# Patient Record
Sex: Male | Born: 1965 | Race: White | Hispanic: No | Marital: Single
Health system: Midwestern US, Academic
[De-identification: ages and names within clinical notes are randomized; demographics above are authoritative.]

---

## 2021-06-05 IMAGING — CR [ID]
3 series · 3 of 3 positions shown · non-contrast
Comparison: none

[chest pa (1 of 2)]
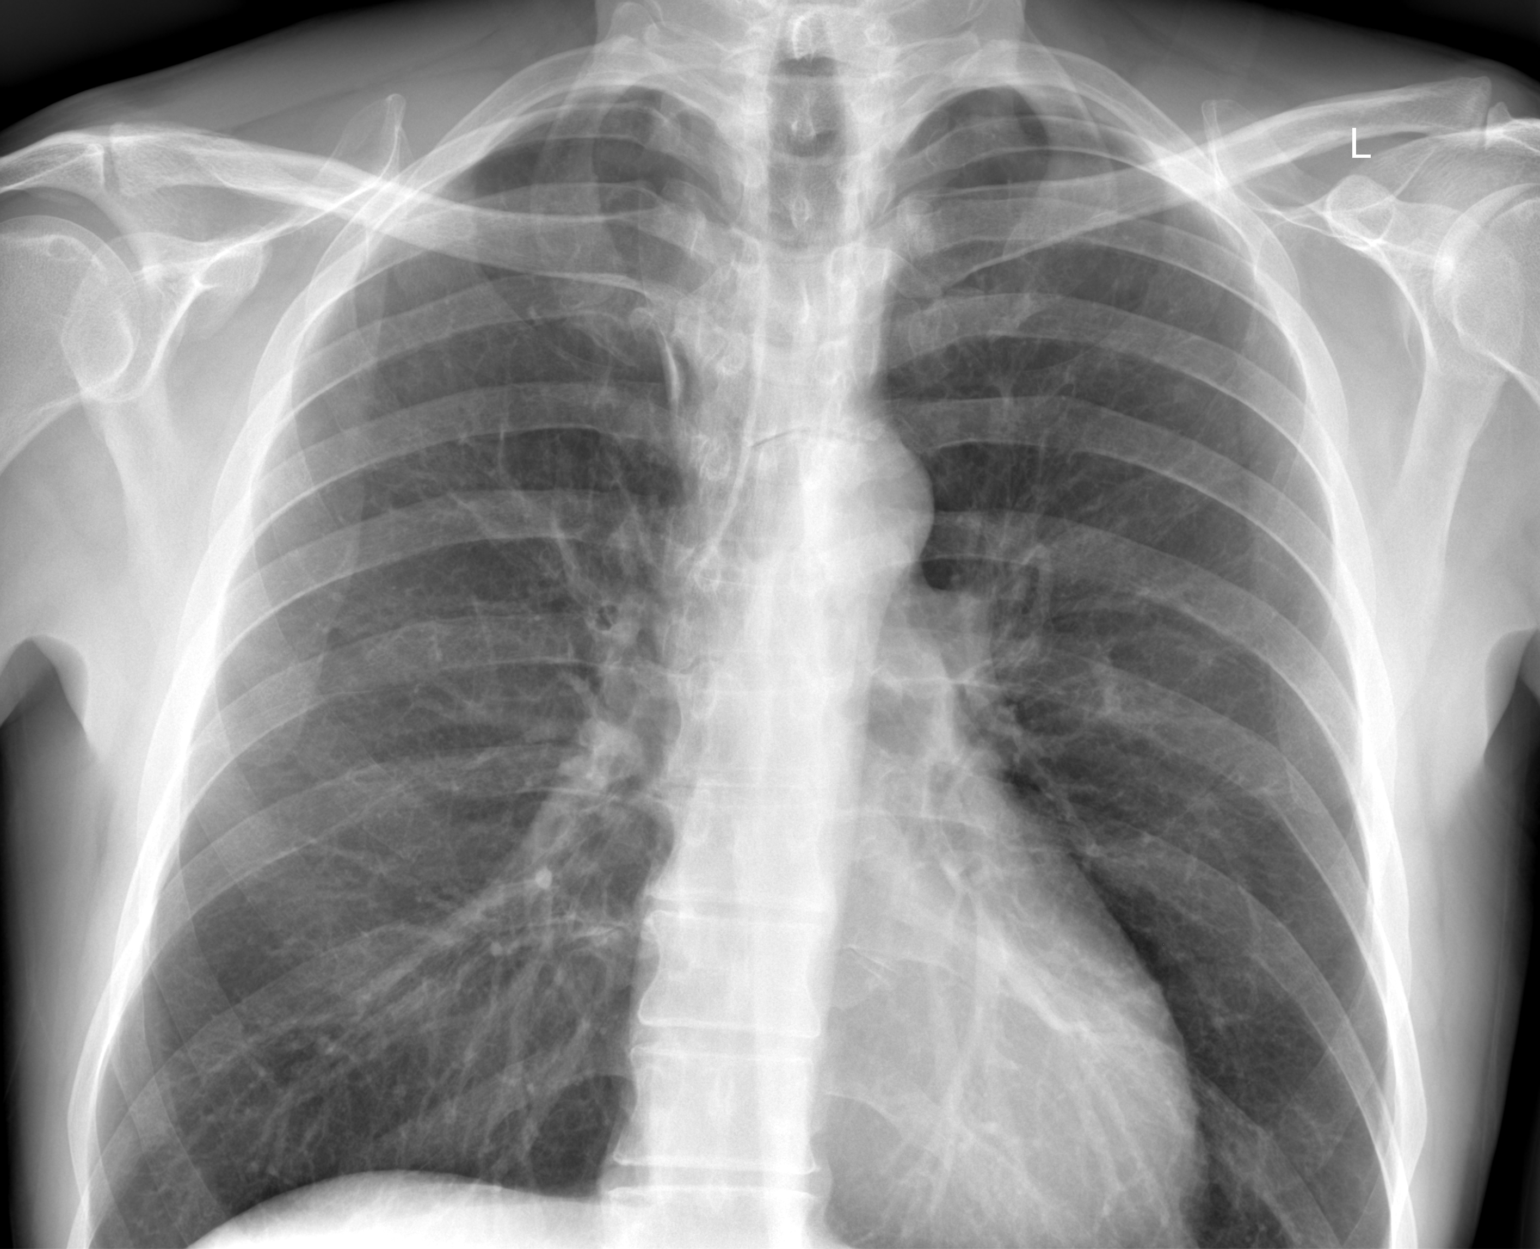

[chest pa (2 of 2)]
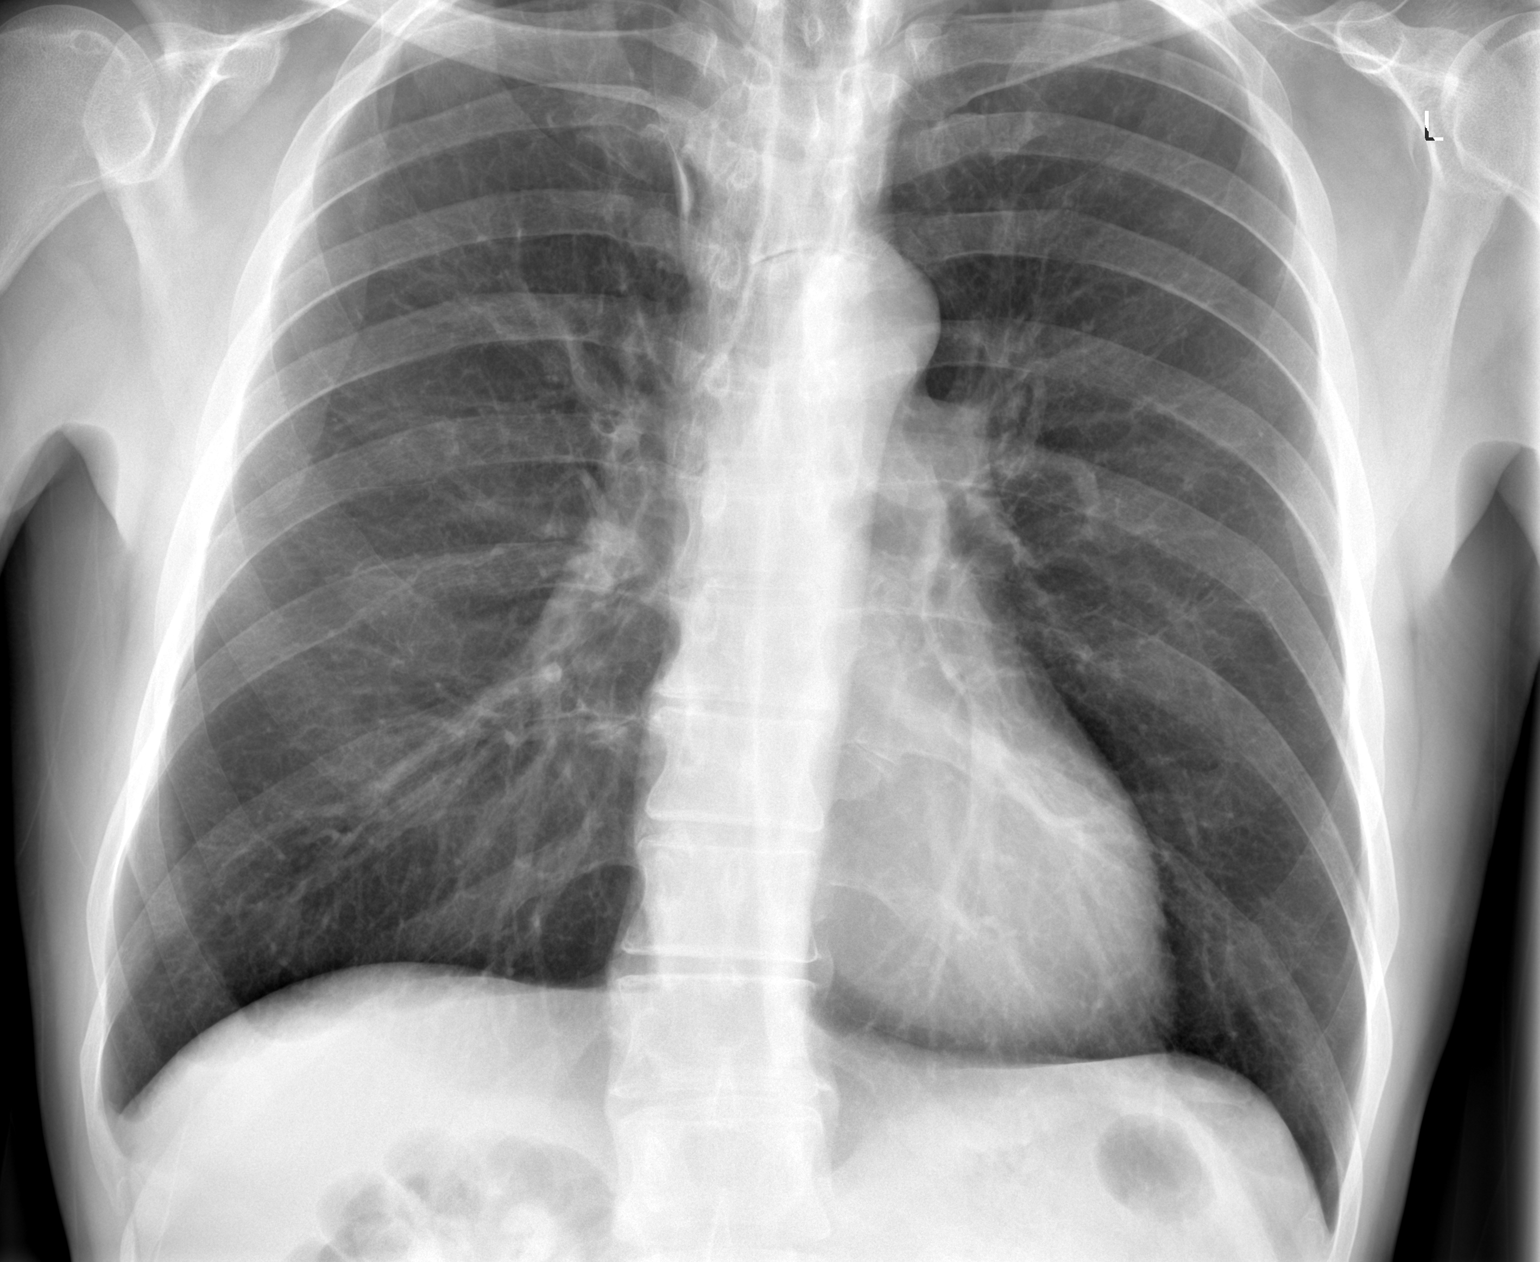

[chest lat]
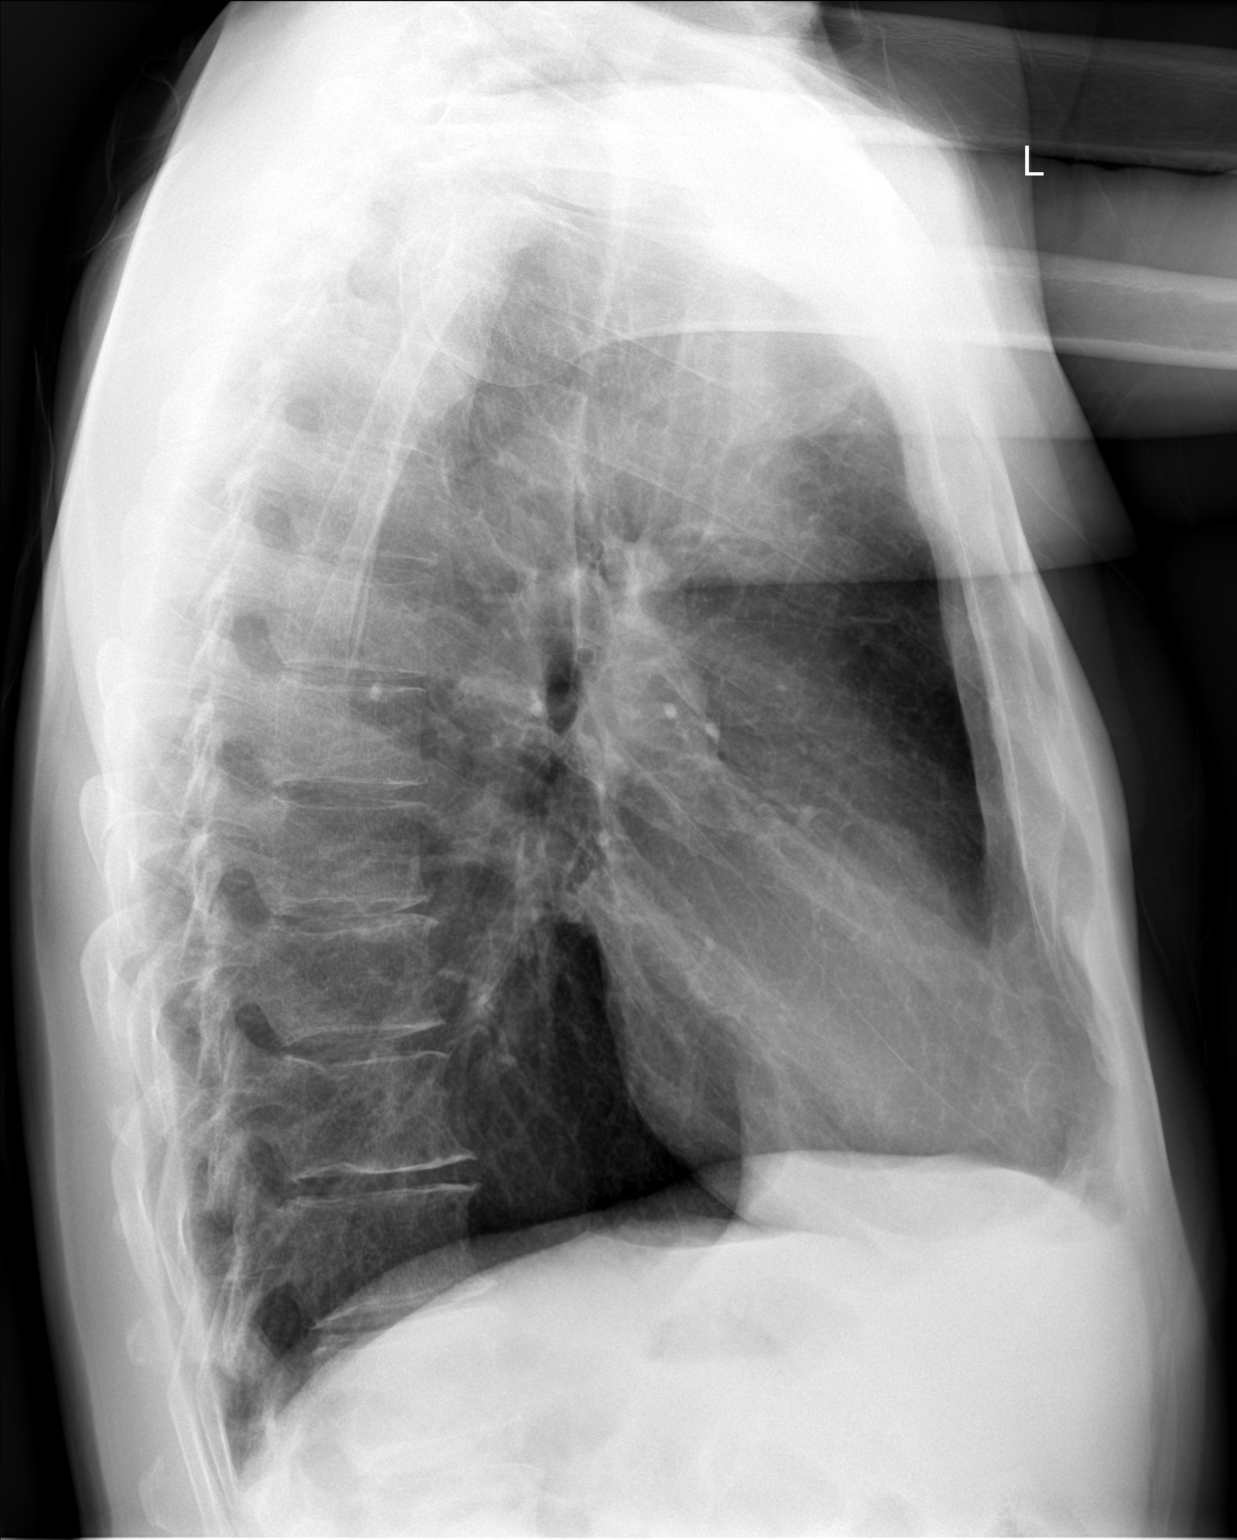

[3 of 3 positions shown; findings below may reference images not displayed]

EXAM

XR chest 2V

INDICATION

EKG findings
Abnormal EKG findings. Pt denies sx hx. CF

TECHNIQUE

PA and Lateral views of the chest

COMPARISONS

None available at the time of dictation.

FINDINGS

No radiographically apparent pleural effusion, consolidation, or pneumothorax.

The cardiomediastinal silhouette is normal in size.

The osseous structures are without an acute osseous abnormality.

IMPRESSION
1. No radiographic evidence of an acute cardiopulmonary process.

Tech Notes:

Abnormal EKG findings. Pt denies sx hx. CF

## 2021-06-24 IMAGING — CR [ID]
2 series · 2 of 2 positions shown · non-contrast
Comparison: none

[x chest ap (1 of 2)]
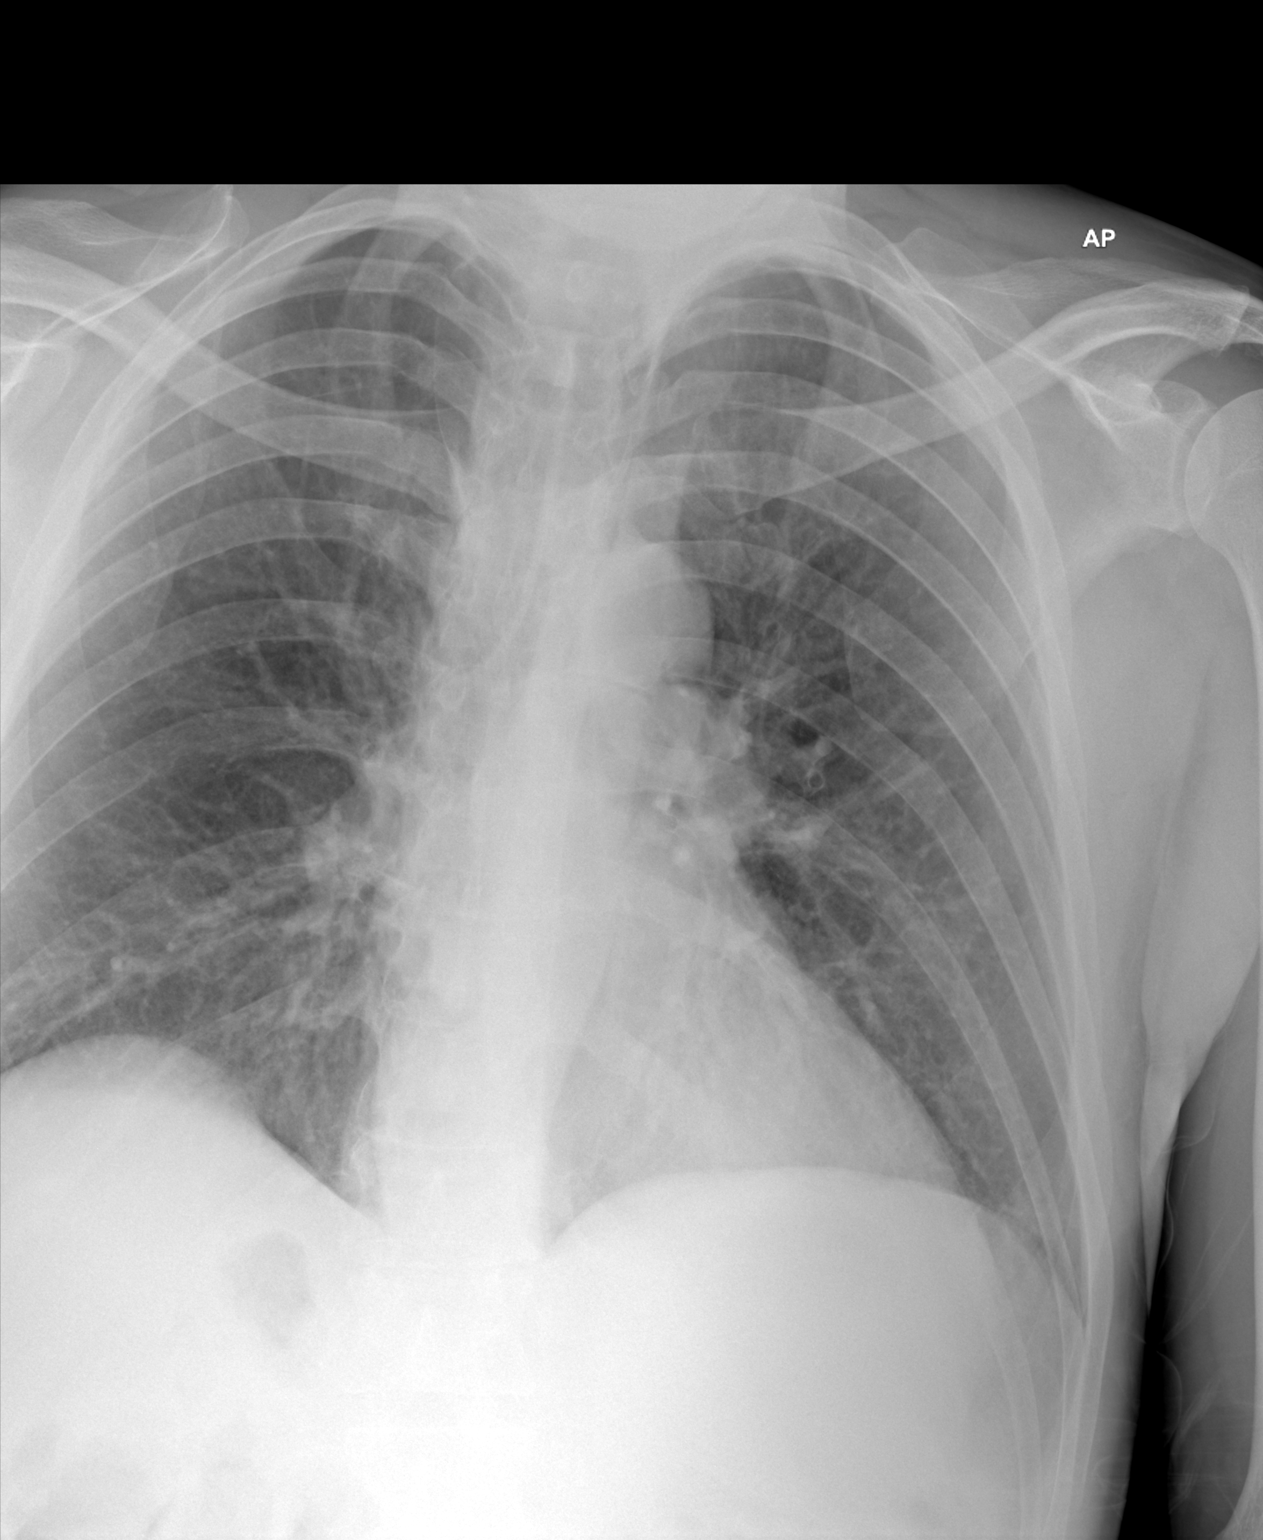

[x chest ap (2 of 2)]
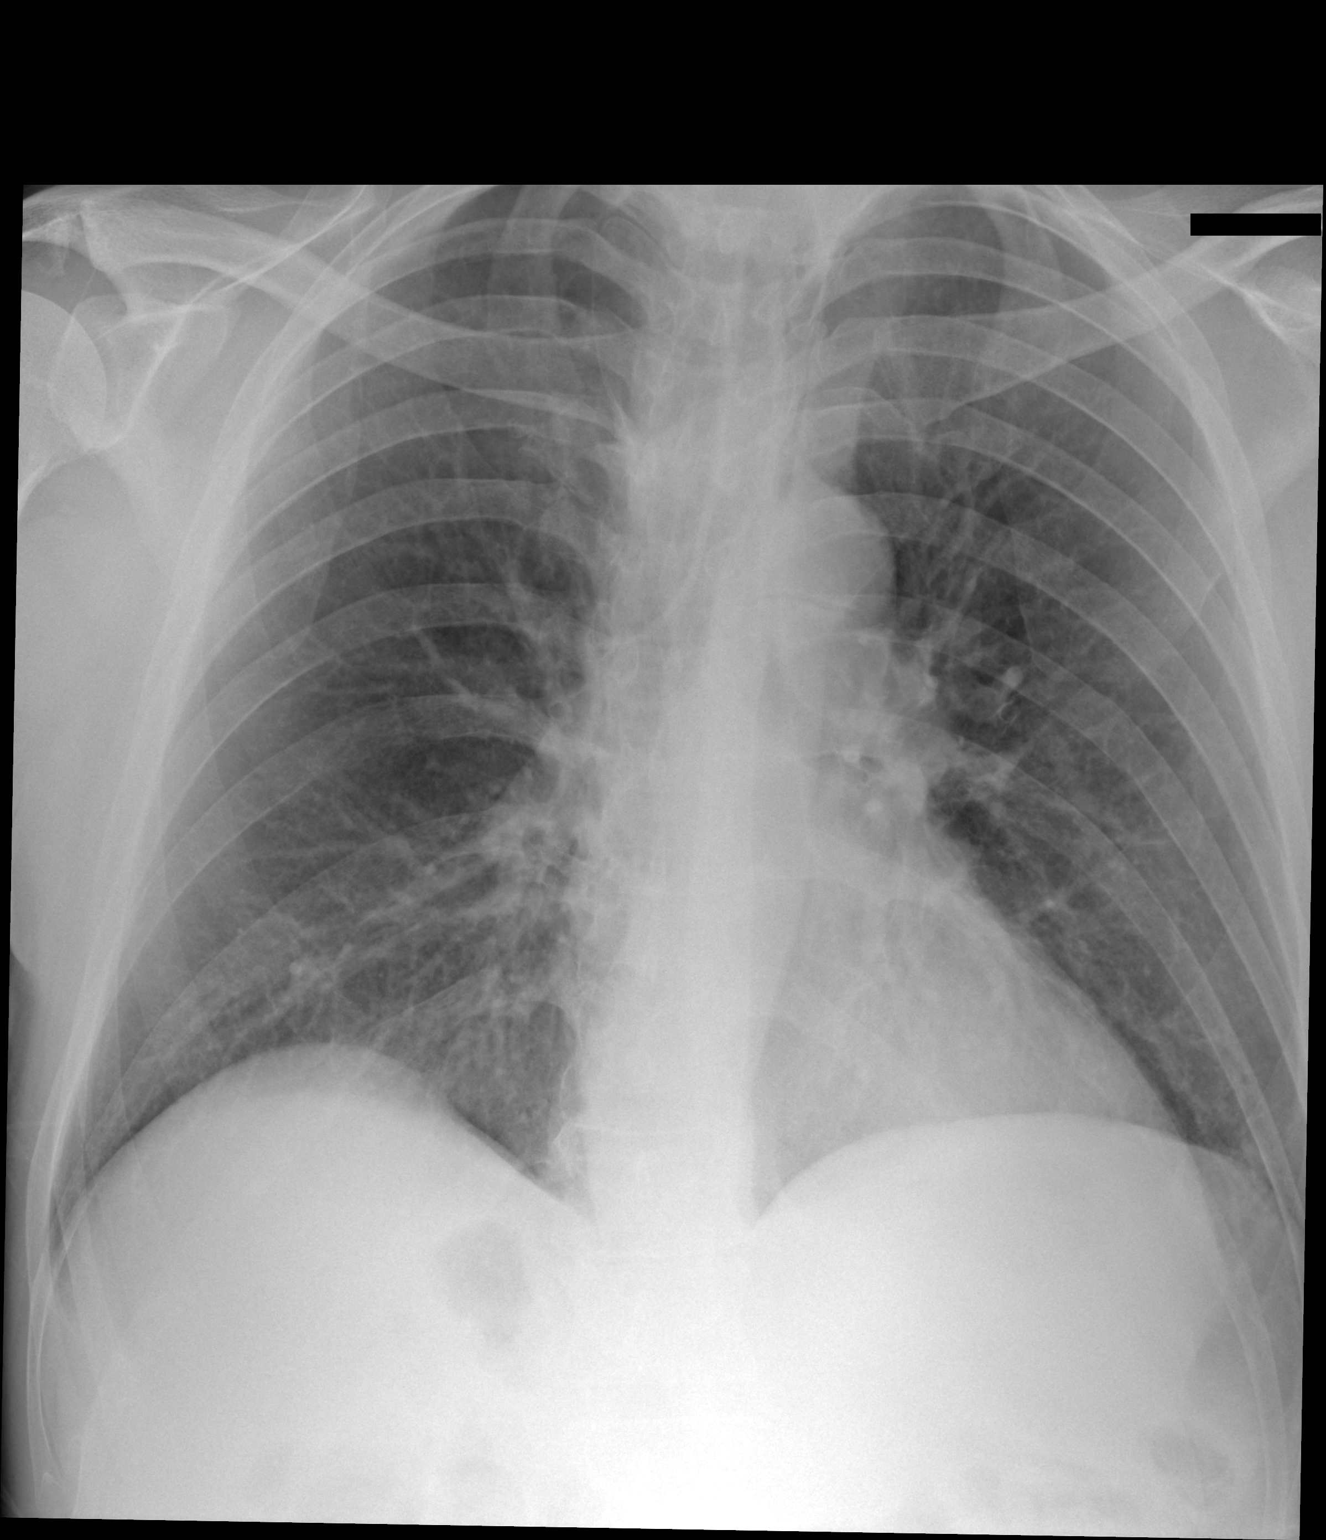

[2 of 2 positions shown; findings below may reference images not displayed]

DIAGNOSTIC STUDIES

EXAM

XR chest 1V

INDICATION

seizures
Seizures. CS

TECHNIQUE

AP chest

COMPARISONS

June 05, 2021

FINDINGS

Cardiac silhouette is unchanged. No acute infiltrates are seen. Osseous structures are unchanged.

IMPRESSION

No acute infiltrates.

Tech Notes:

Seizures. CS

## 2021-06-24 IMAGING — CT BRAIN WO(Adult)
3 of 4 series · 14 of 47 positions shown, 16 images · non-contrast
Comparison: none

[Series 4: brain cor 5.00 hr40 s3 · coronal · 0.32mm/px · 3 of 43 slices shown]
[im 15/43  brain]
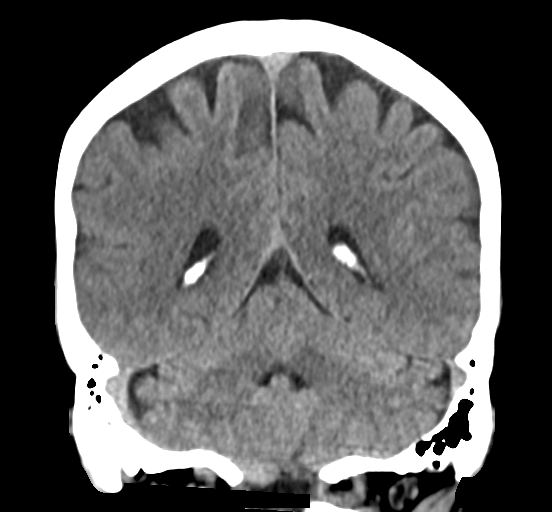
[im 19/43  brain]
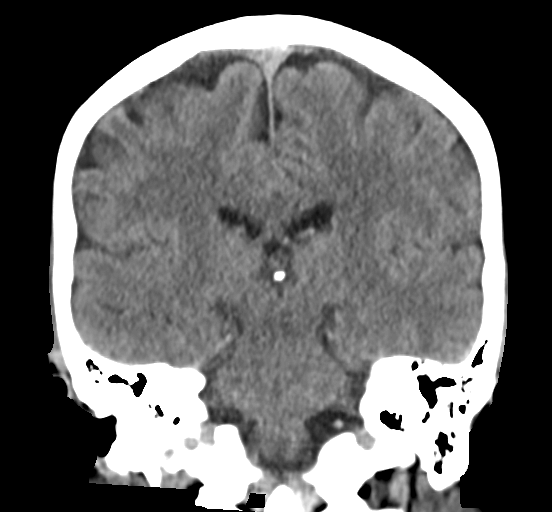
[im 24/43  brain]
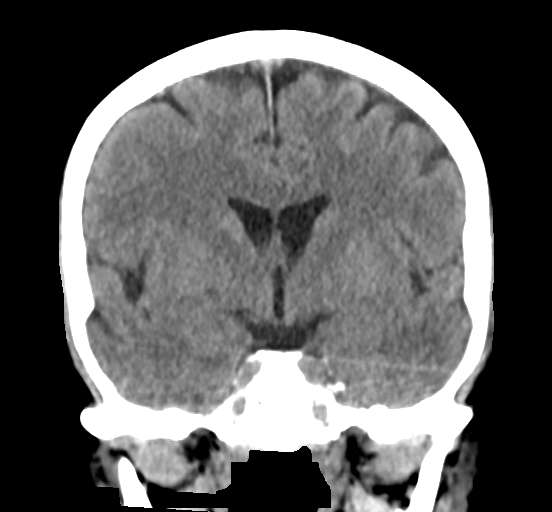

[Series 6: brain sag 5.00 hr40 s3 · sagittal · 0.32mm/px · 3 of 35 slices shown]
[im 12/35  brain]
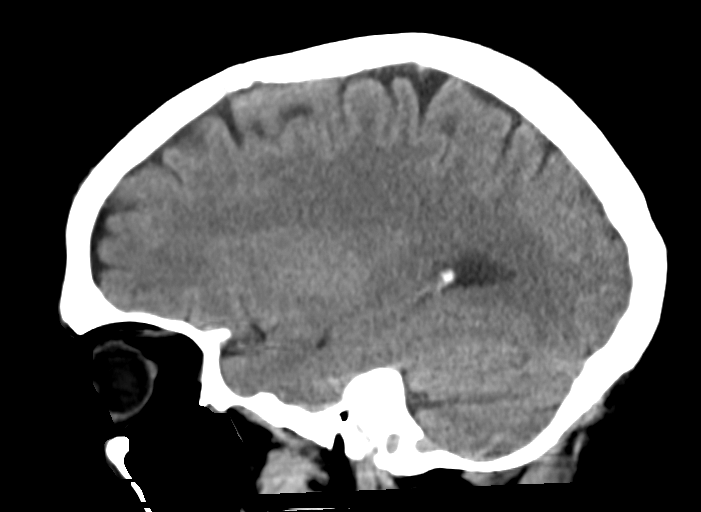
[im 18/35  brain]
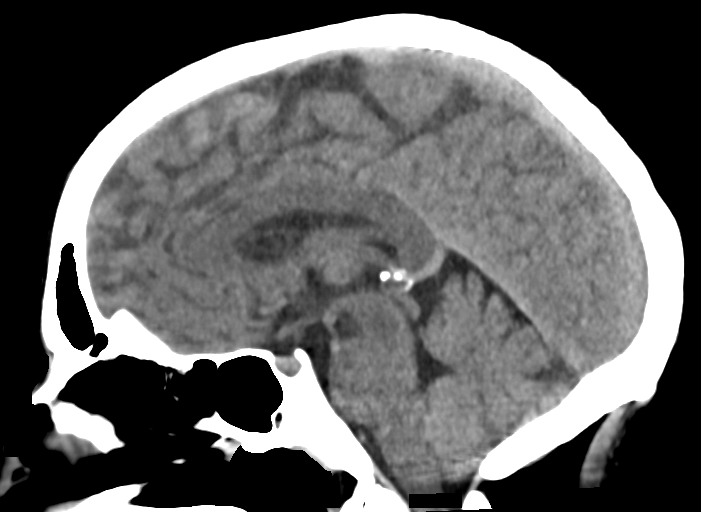
[im 23/35  brain]
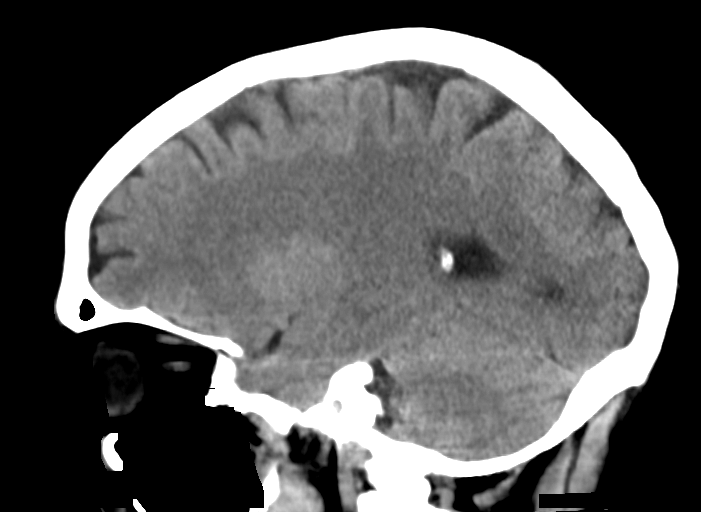

[Series 8: brain ax 2.00 hr60 s3 · axial · 0.36mm/px · z∈[-531,-396]mm · 8 of 84 slices shown, 10 images]
[im 8/84  brain]
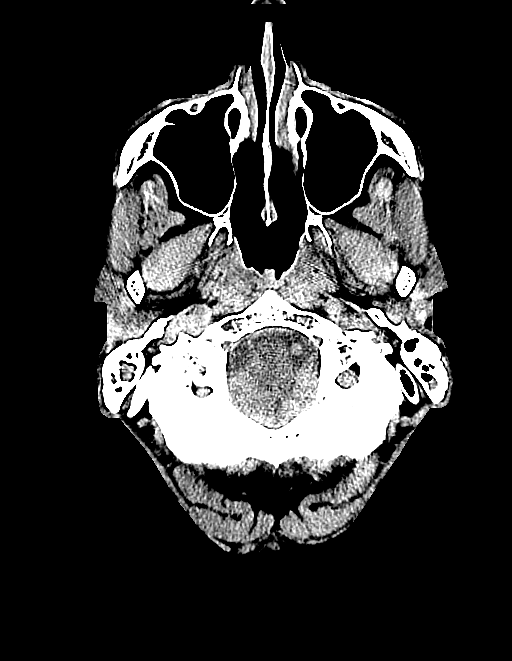
[im 8/84  bone]
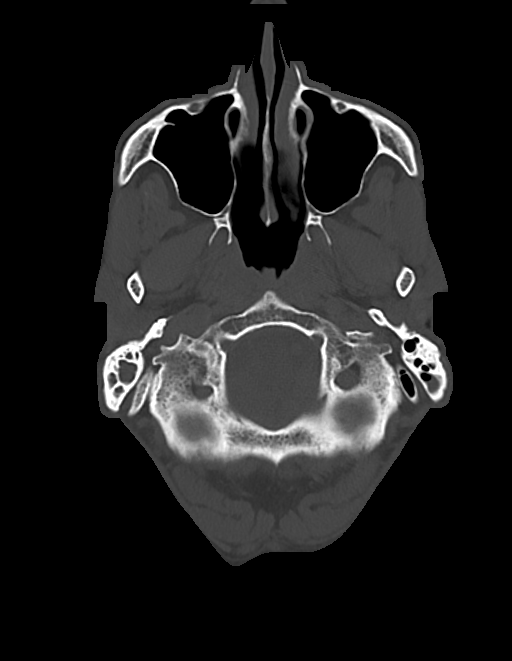
[im 16/84  brain]
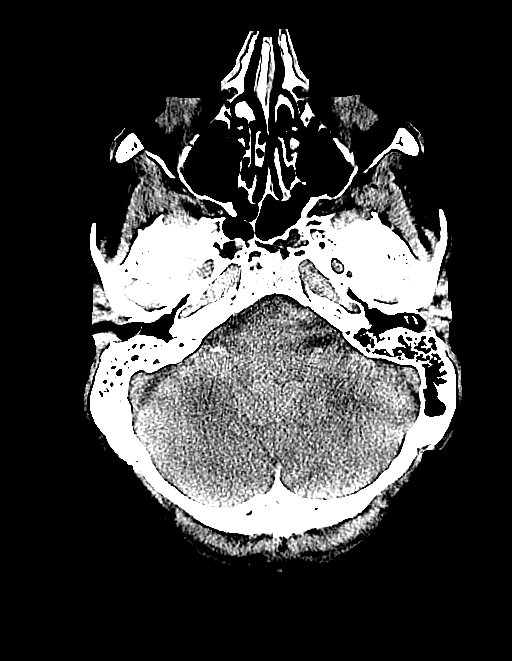
[im 28/84  brain]
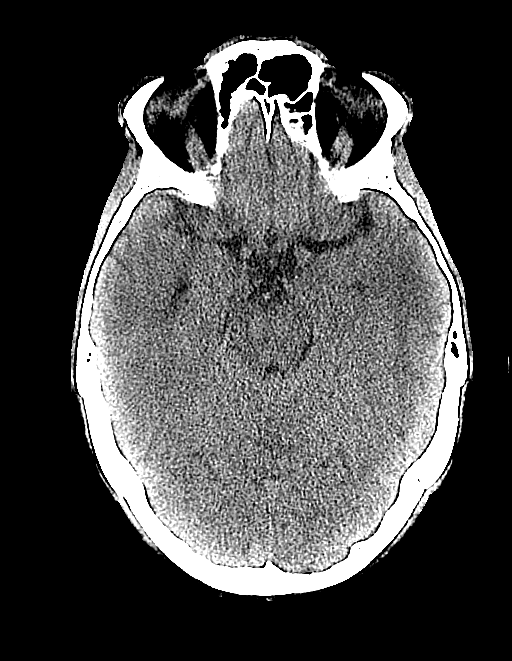
[im 36/84  brain]
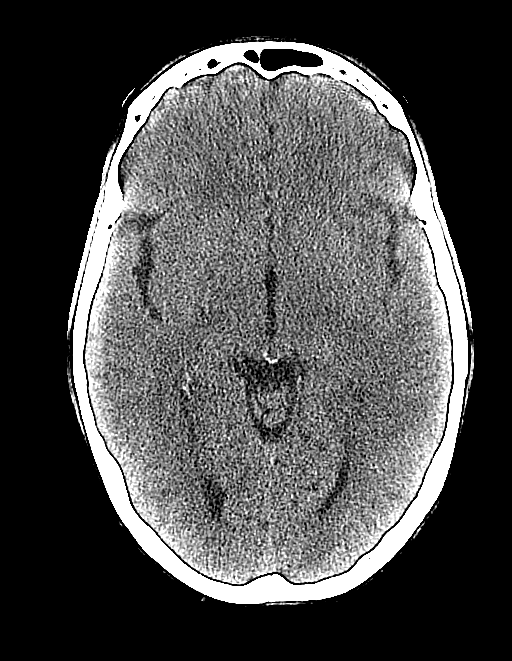
[im 48/84  brain]
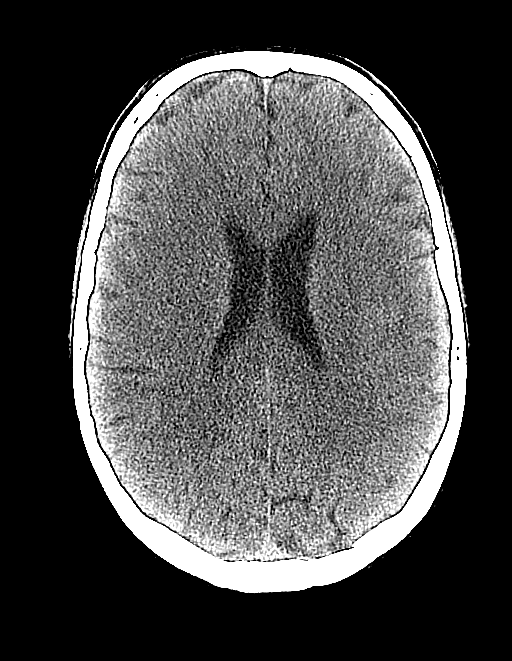
[im 48/84  bone]
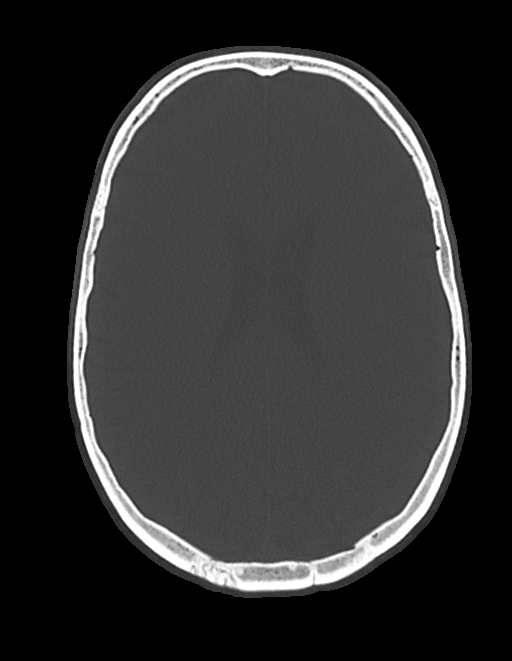
[im 56/84  brain]
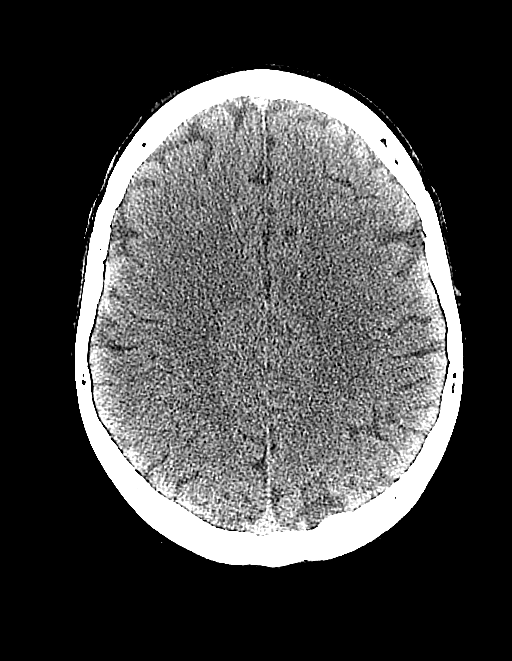
[im 68/84  brain]
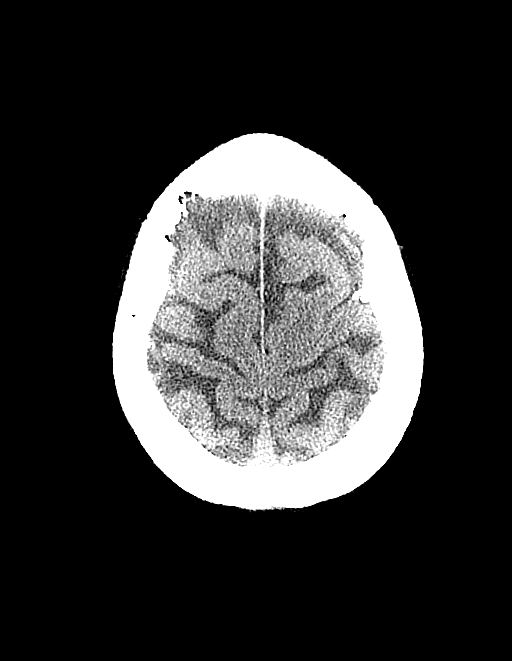
[im 76/84  brain]
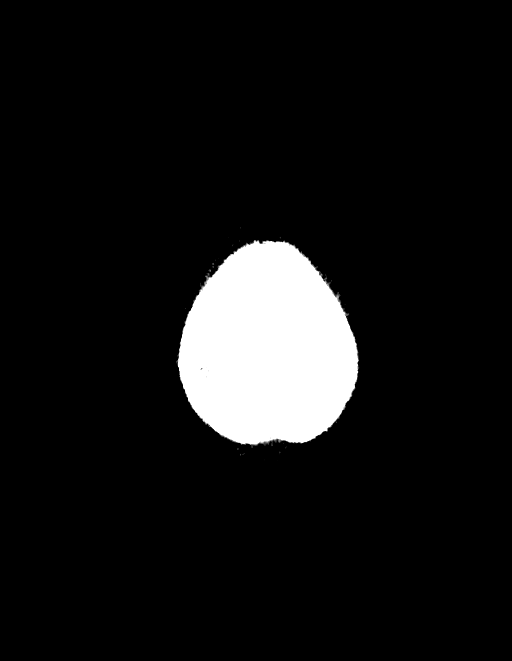

[14 of 47 positions shown; findings below may reference images not displayed]

DIAGNOSTIC STUDIES

EXAM

CT head without contrast.

INDICATION

trauma seizure
PT STATES NOT FEELING WELL. SEIZURE THIS MORNING. HIT RIGHT FRONTAL REGION OF HEAD. CT/NM 0/0. TJ

TECHNIQUE

All CT scans at this facility use dose modulation, iterative reconstruction, and/or weight based
dosing when appropriate to reduce radiation dose to as low as reasonably achievable.

Number of previous computed tomography exams in the last 12 months is 0 .

Number of previous nuclear medicine myocardial perfusion studies in the last 12 months is 0 .

COMPARISONS

None available

FINDINGS

No intracranial mass or hemorrhage is seen. Ventricular system is normal. Bony calvarium is
unremarkable.

IMPRESSION

Negative CT scan of the head. Report called Dr. Heidycita at [DATE] a.m..

Tech Notes:

PT STATES  NOT FEELING WELL. SEIZURE THIS MORNING. HIT RIGHT FRONTAL REGION OF HEAD. CT/NM 0/0. TJ

## 2021-07-12 IMAGING — MR Head^Brain
9 of 10 series · 41 of 48 positions shown · IV contrast (with contrast)
Comparison: none

[Series 2: T1 · sagittal · 5.0mm · 0.45mm/px · 5 of 6 slices shown (1 of 2)]
[im 1/6]
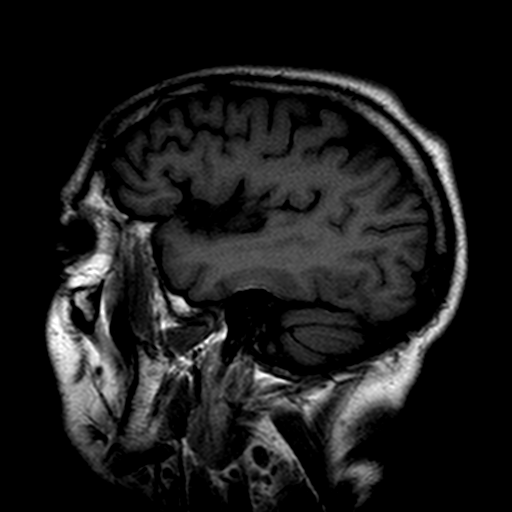
[im 2/6]
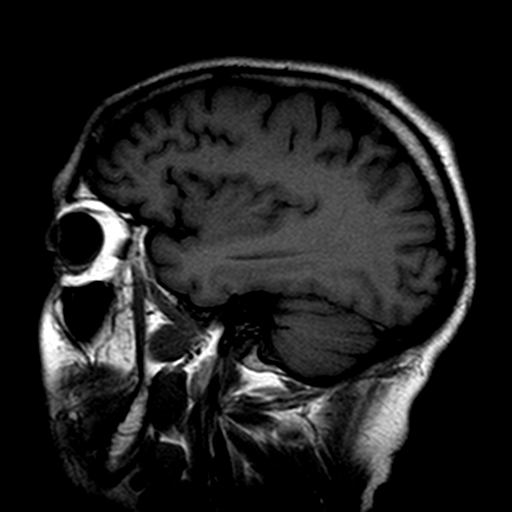
[im 3/6]
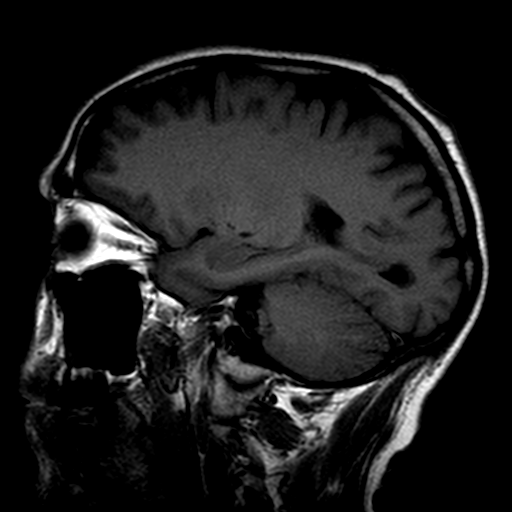
[im 4/6]
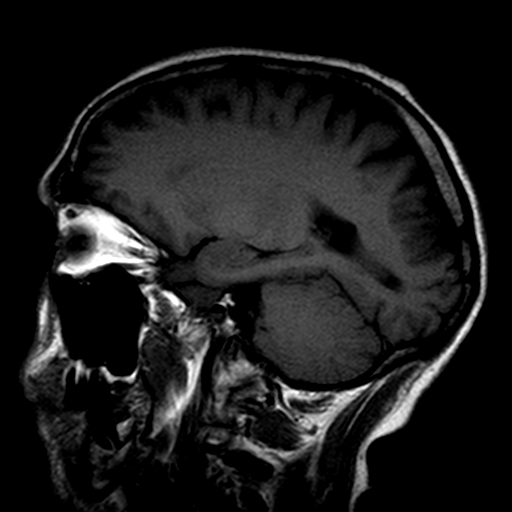
[im 6/6]
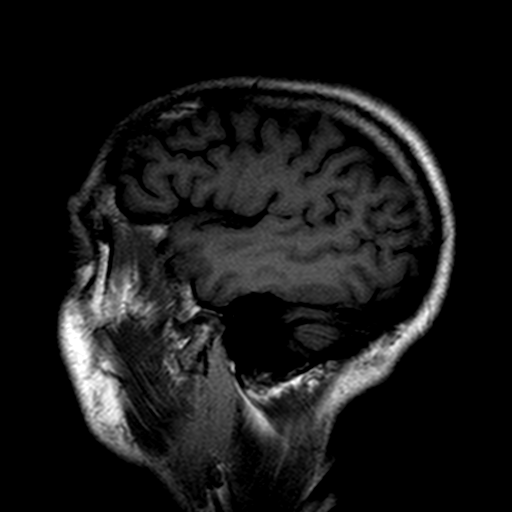

[Series 3: DWI · axial · 5.0mm · 1.80mm/px · z∈[-46,+78]mm · 9 of 20 slices shown (1 of 2)]
[im 1/20]
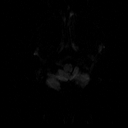
[im 4/20]
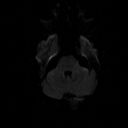
[im 6/20]
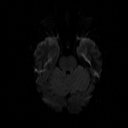
[im 9/20]
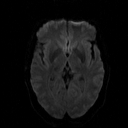
[im 11/20]
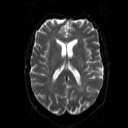
[im 14/20]
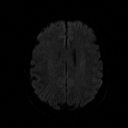
[im 16/20]
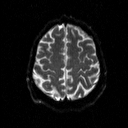
[im 18/20]
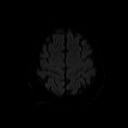
[im 20/20]
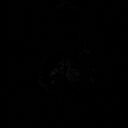

[Series 4: DWI · axial · 5.0mm · 1.80mm/px · z∈[-15,+78]mm · 4 of 7 slices shown (2 of 2)]
[im 1/7]
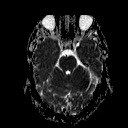
[im 3/7]
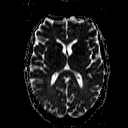
[im 5/7]
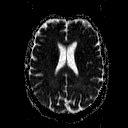
[im 7/7]
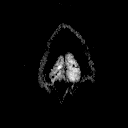

[Series 5: FLAIR · axial · 5.0mm · 0.45mm/px · z∈[-52,+9]mm · 3 of 5 slices shown]
[im 1/5]
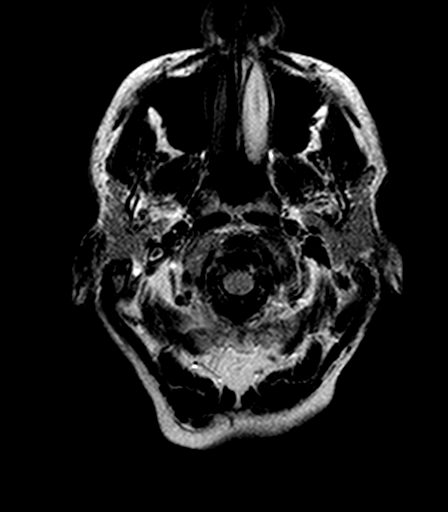
[im 3/5]
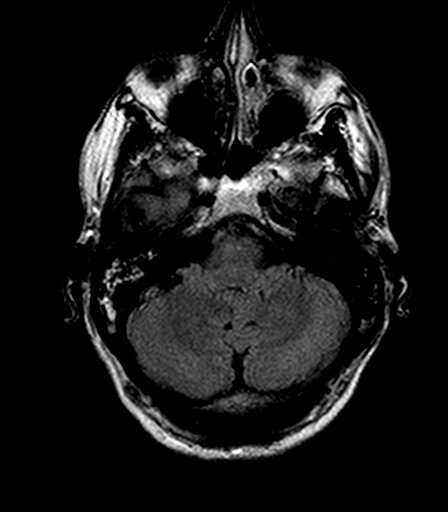
[im 5/5]
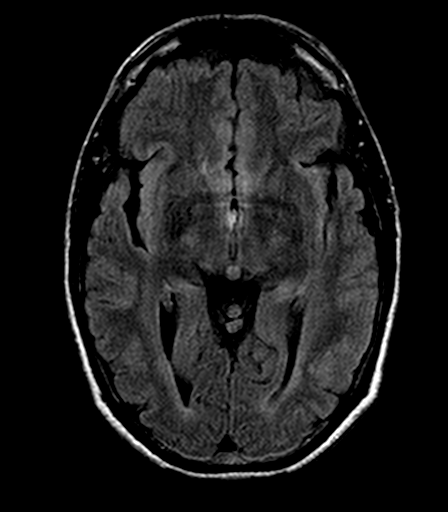

[Series 6: T2 · axial · 5.0mm · 0.72mm/px · z∈[-46,+70]mm · 2 of 4 slices shown (1 of 2)]
[im 1/4]
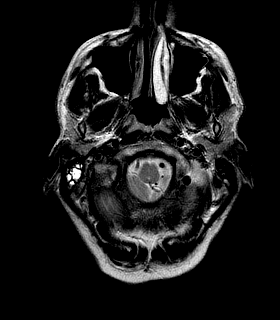
[im 4/4]
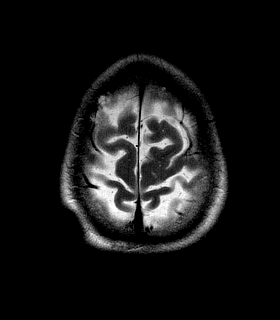

[Series 7: T1 · axial · 5.0mm · 0.45mm/px · z∈[-16,+83]mm · 4 of 7 slices shown (2 of 2)]
[im 1/7]
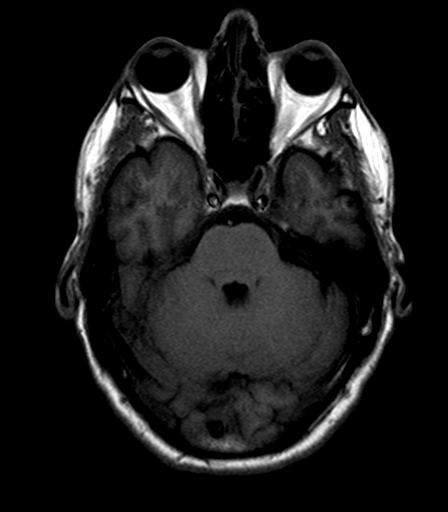
[im 3/7]
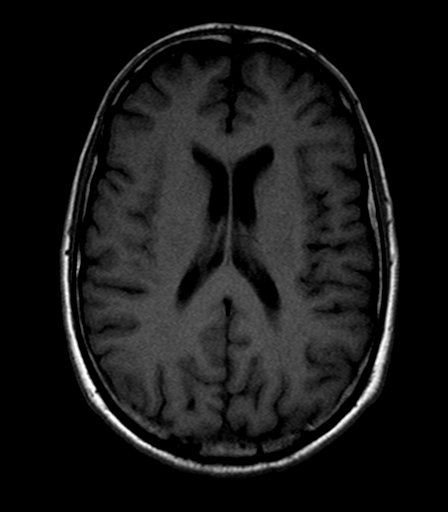
[im 5/7]
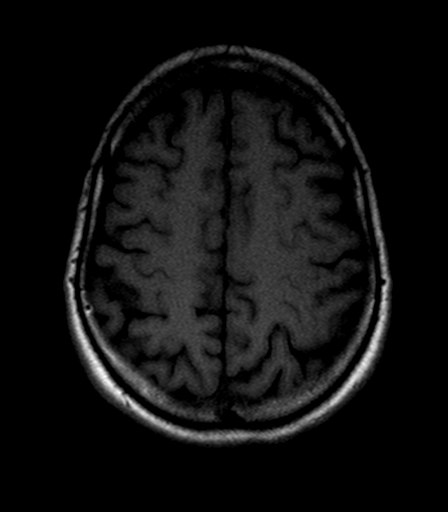
[im 7/7]
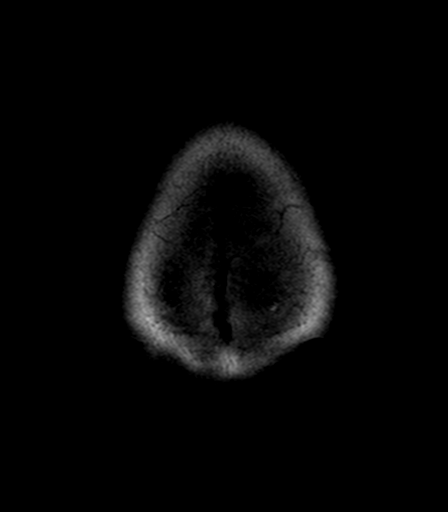

[Series 9: T2 · coronal · 5.0mm · 0.69mm/px · 4 of 6 slices shown (2 of 2)]
[im 1/6]
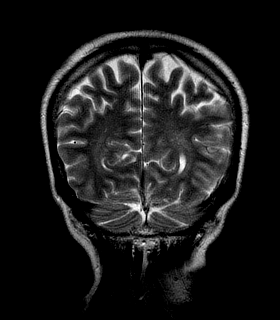
[im 2/6]
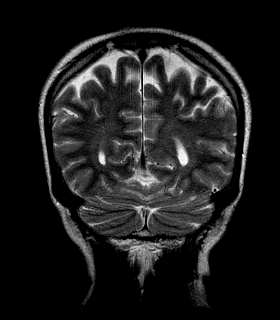
[im 4/6]
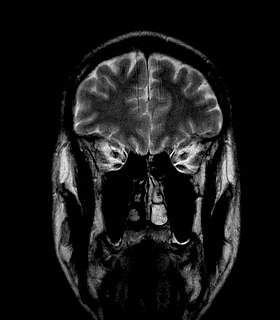
[im 6/6]
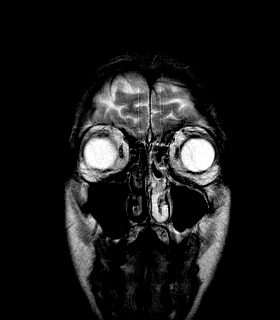

[Series 10: T1 fat-sat post-contrast · axial · 5.0mm · 0.90mm/px · z∈[-52,+89]mm · 4 of 7 slices shown (1 of 2)]
[im 1/7]
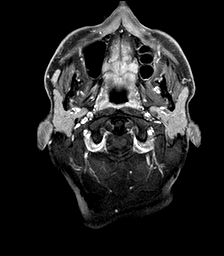
[im 3/7]
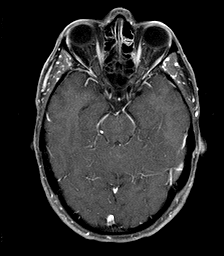
[im 5/7]
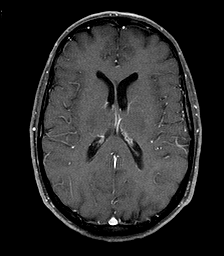
[im 7/7]
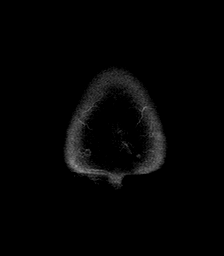

[Series 11: T1 fat-sat post-contrast · coronal · 5.0mm · 0.90mm/px · 6 of 9 slices shown (2 of 2)]
[im 1/9]
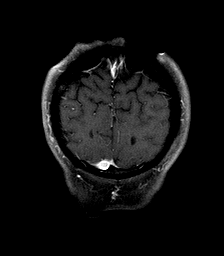
[im 2/9]
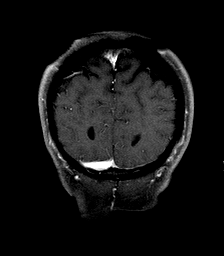
[im 4/9]
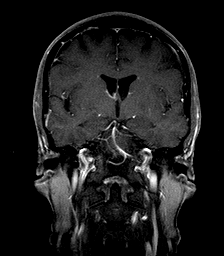
[im 5/9]
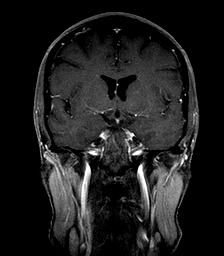
[im 7/9]
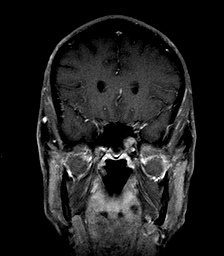
[im 9/9]
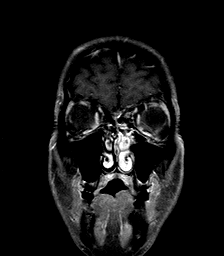

[41 of 48 positions shown; findings below may reference images not displayed]

------------- REPORT [id number] -------------
EXAM

MAGNETIC RESONANCE IMAGING, BRAIN (INCLUDING BRAIN STEM) WITHOUT CONTRAST MATERIAL, FOLLOWED BY
CONTRAST MATERIAL(S) AND FURTHER SEQUENCES, CPT 80214

INDICATION

NEW ONSET SEIZURES SINCE LAST June 2022, A FEW MONTHS APART, STARTING NEW ANTI-SEIZURE MEDICATIONS.
15 ML GADAVIST RG

TECHNIQUE

Multiplanar, multiecho sequences were generated through the brain utilizing a 1.5 Tesla magnet. 15
mL of Gadavist were administered intravenously.

COMPARISONS

None

FINDINGS

There is no evidence of restricted diffusion to suggest an acute/subacute cerebrovascular accident.
There is no mass effect or shift in the midline structures.

Normal T2 flows are identified in the vertebral arteries, the basilar artery, the cavernous
carotids, and the major vessels of the circle of Willis. There are no retrobulbar masses. There is
no evidence of significant white matter disease.

Visualized portions of the maxillary, ethmoid, sphenoid, and frontal sinuses are clear. No evidence
of blood products was identified on the gradient-echo images.

Sagittal images demonstrate no evidence of herniation of the cerebellar tonsils. The brainstem,
corpus callosum, pituitary, sella turcica, optic chiasm, and clivus appear unremarkable. There is
normal fatty marrow signal in the skull base and overlying calvarium.

Following the infusion of gadolinium, there are no abnormal enhancing masses.

IMPRESSION

Normal contrast-enhanced MRI of the brain.

Tech Notes:

NEW ONSET SEIZURES SINCE LAST June 2022, A FEW MONTHS APART, STARTING NEW ANTI-SEIZURE MEDICATIONS.
15 ML GADAVIST RG

------------- REPORT [id number] -------------
**ADDENDUM**
ADDENDUM

There is opacification of the mastoid air cells right greater than left suspicious for mastoiditis.

TD/TT: /

EXAM

MAGNETIC RESONANCE IMAGING, BRAIN (INCLUDING BRAIN STEM) WITHOUT CONTRAST MATERIAL, FOLLOWED BY
CONTRAST MATERIAL(S) AND FURTHER SEQUENCES, CPT 38030

INDICATION

NEW ONSET SEIZURES SINCE LAST June 2022, A FEW MONTHS APART, STARTING NEW ANTI-SEIZURE MEDICATIONS.
15 ML GADAVIST RG

TECHNIQUE

Multiplanar, multiecho sequences were generated through the brain utilizing a 1.5 Tesla magnet. 15
mL of Gadavist were administered intravenously.

COMPARISONS

None

FINDINGS

There is no evidence of restricted diffusion to suggest an acute/subacute cerebrovascular accident.
There is no mass effect or shift in the midline structures.

Normal T2 flows are identified in the vertebral arteries, the basilar artery, the cavernous
carotids, and the major vessels of the circle of Willis. There are no retrobulbar masses. There is
no evidence of significant white matter disease.

Visualized portions of the maxillary, ethmoid, sphenoid, and frontal sinuses are clear. No evidence
of blood products was identified on the gradient-echo images.

Sagittal images demonstrate no evidence of herniation of the cerebellar tonsils. The brainstem,
corpus callosum, pituitary, sella turcica, optic chiasm, and clivus appear unremarkable. There is
normal fatty marrow signal in the skull base and overlying calvarium.

Following the infusion of gadolinium, there are no abnormal enhancing masses.

IMPRESSION

Normal contrast-enhanced MRI of the brain.

Tech Notes:

NEW ONSET SEIZURES SINCE LAST June 2022, A FEW MONTHS APART, STARTING NEW ANTI-SEIZURE MEDICATIONS.
15 ML GADAVIST RG

## 2021-07-12 IMAGING — US ABDLM
1 series · 7 of 7 positions shown · non-contrast
Comparison: none

[Series 1: us abdomen limited · 7 of 7 slices shown]
[im 1/7]
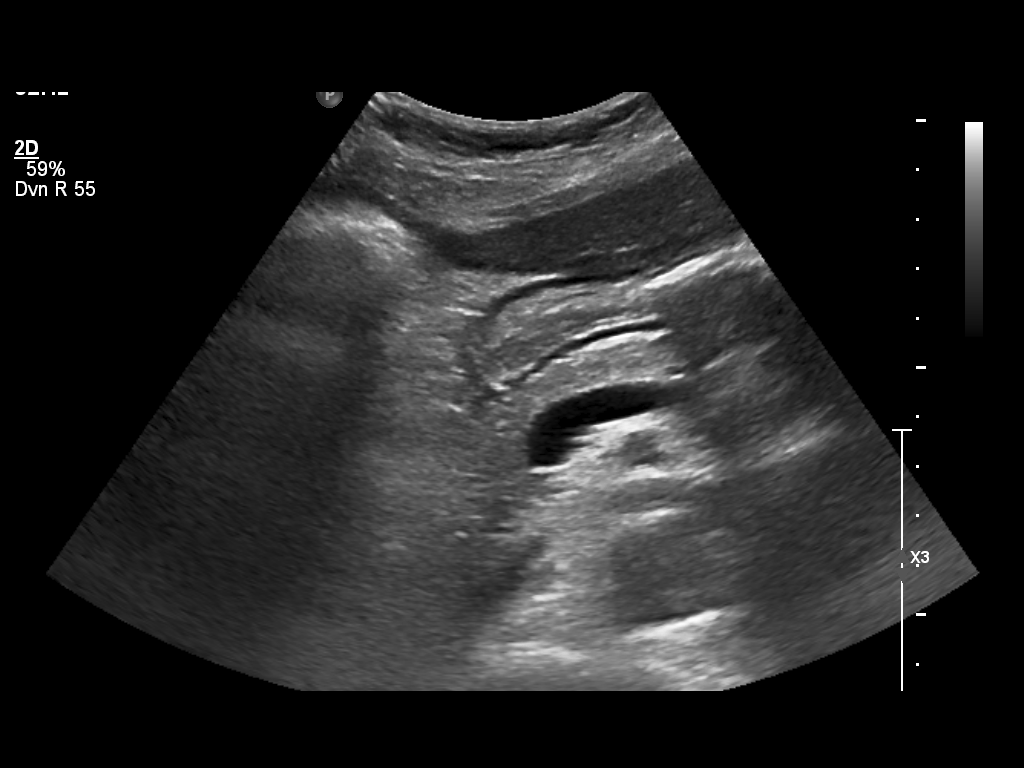
[im 2/7]
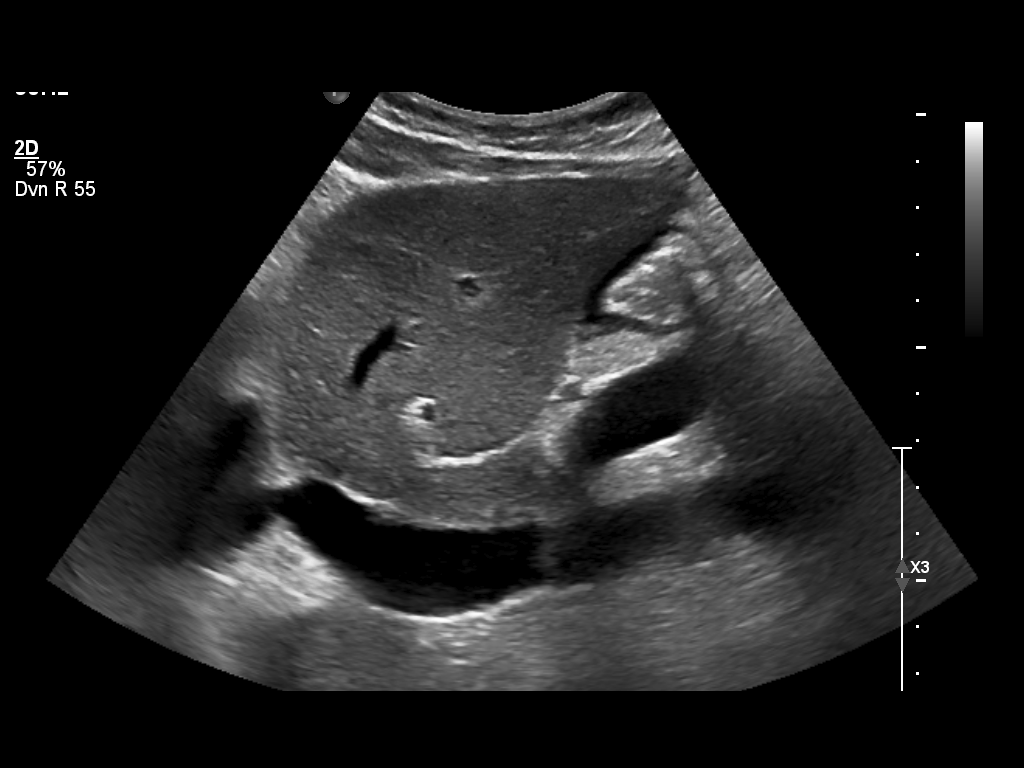
[im 3/7]
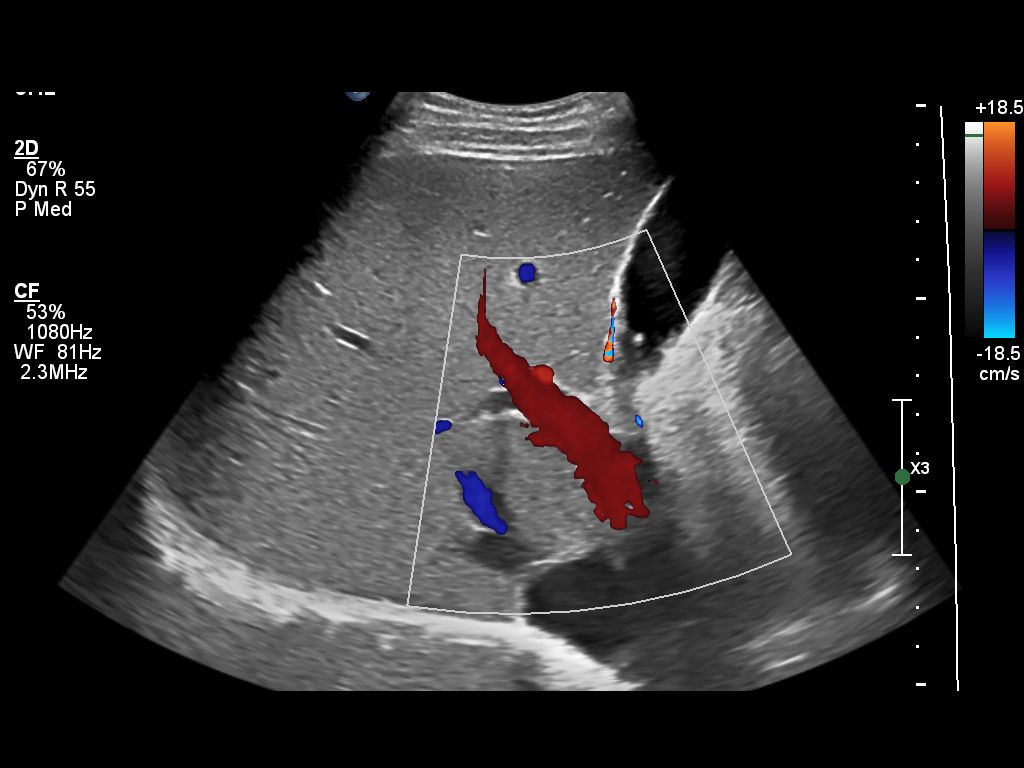
[im 4/7]
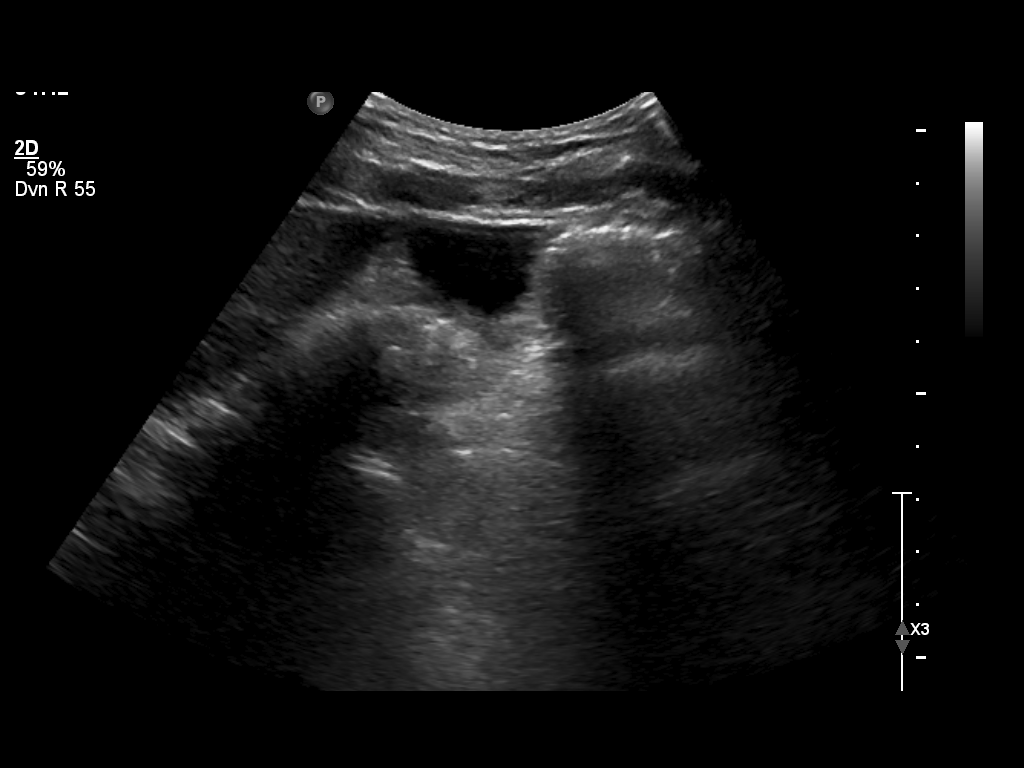
[im 5/7]
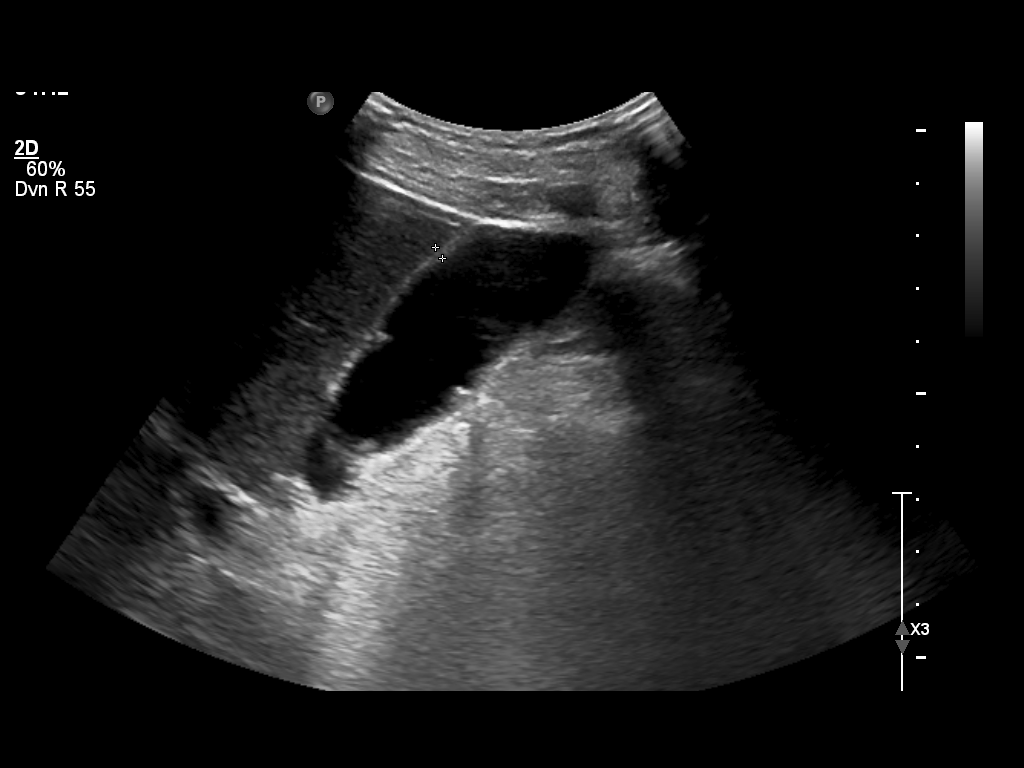
[im 6/7]
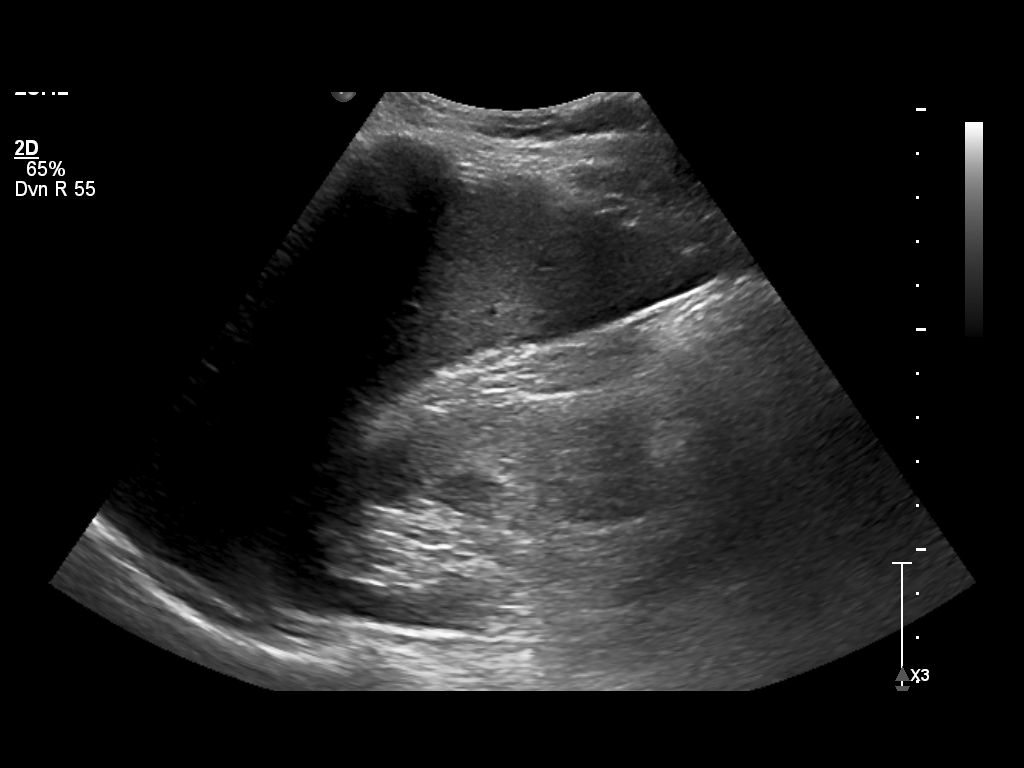
[im 7/7]
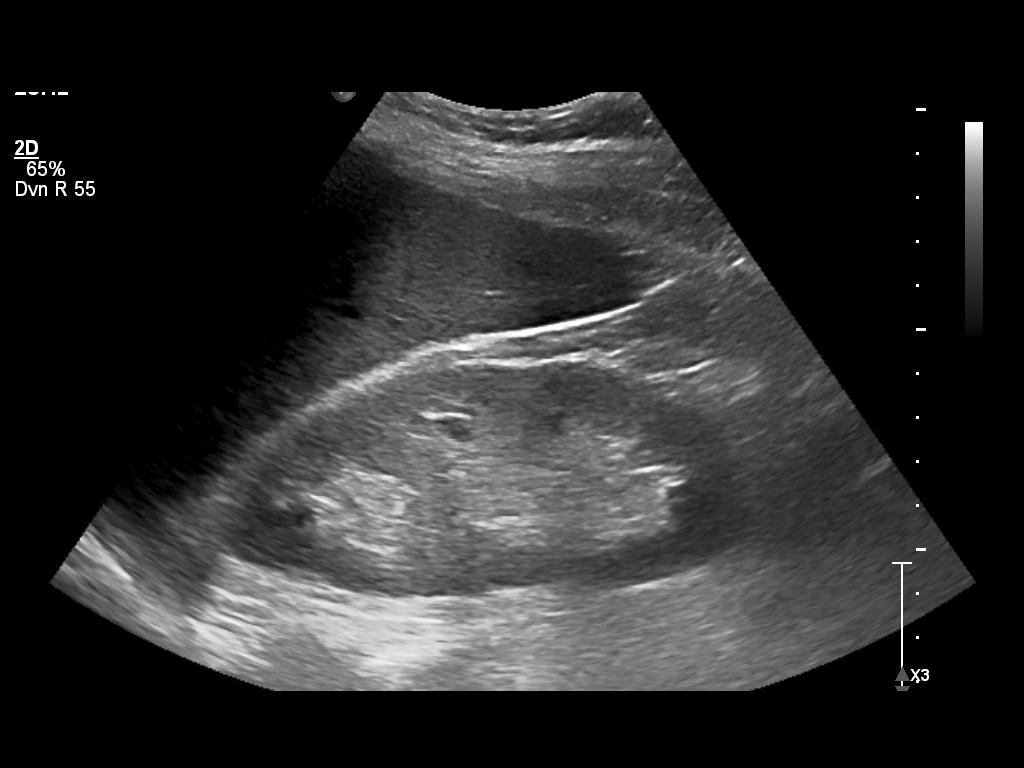

[7 of 7 positions shown; findings below may reference images not displayed]

EXAM

ULTRASOUND, ABDOMINAL, LIMITED; CPT 01066

INDICATION

Abnormal liver enzymes.

TECHNIQUE

Multiple static grayscale and color Doppler ultrasound images provided from a transabdominal
ultrasound.

COMPARISONS

There are no previous examinations available for comparison at the time of dictation.

FINDINGS

The liver appears normal in size and echogenicity. There is no evidence of gallstones,
pericholecystic fluid or focal gallbladder wall thickening.

Gallbladder polyps were identified. The largest polyp measures 7 x 6 mm. The gallbladder wall
measures 2.5 mm. The common bile duct measures  3 mm. There is no evidence of a sonographic Murphy
sign.

The pancreatic head appears within normal limits. The pancreatic body and tail are obscured by
overlying bowel gas.

The right kidney measures 11.2 x 5.1 x 5.7 cm. The right renal cortex measures 1.25 cm.

The aorta and inferior vena cava are normal in caliber and contour.

IMPRESSION

Multiple gallbladder wall polyps are noted. There is no evidence of gallstones, pericholecystic
fluid or focal gallbladder wall thickening. The pancreatic body and tail are obscured by overlying
bowel gas.

Tech Notes:

abnormal liver enzymes

## 2021-09-10 IMAGING — MR MR LUMBAR SPINE^[PERSON_NAME] LUMBAR SPINE
2 series · 19 of 43 positions shown · non-contrast
Comparison: none

[Series 5: t2_(person_name)_(person_name)_(person_name) · axial · 4.0mm · 0.43mm/px · z∈[-174,-4]mm · 8 of 14 slices shown]
[im 1/14]
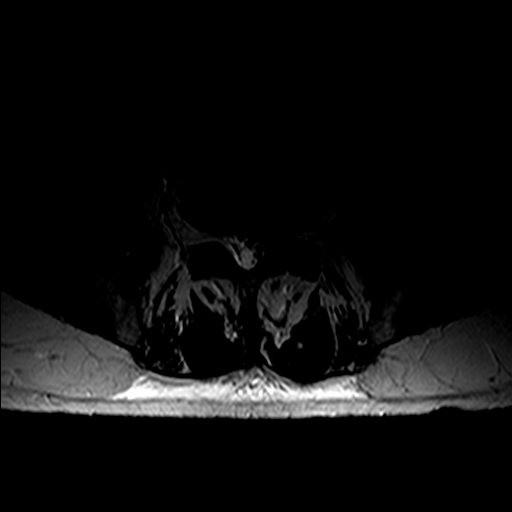
[im 3/14]
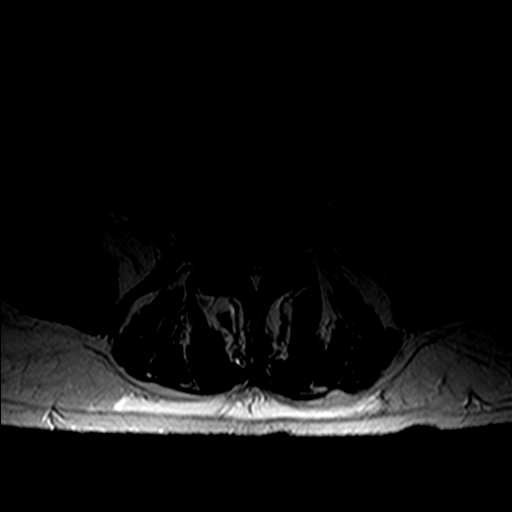
[im 5/14]
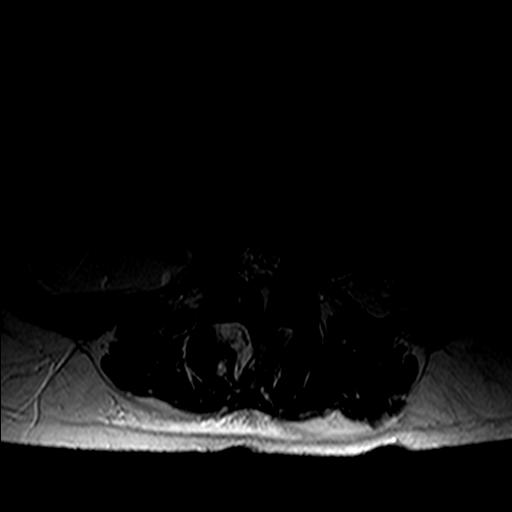
[im 7/14]
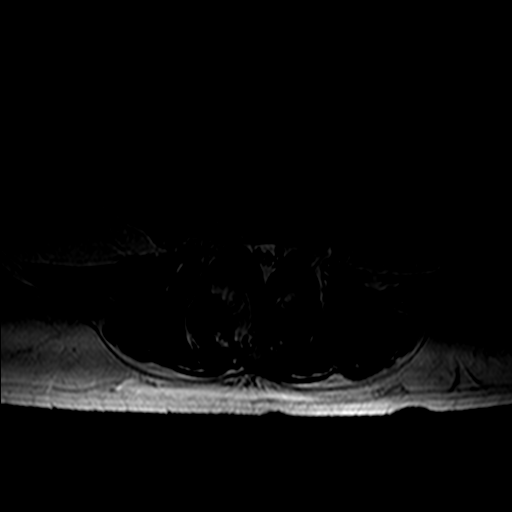
[im 8/14]
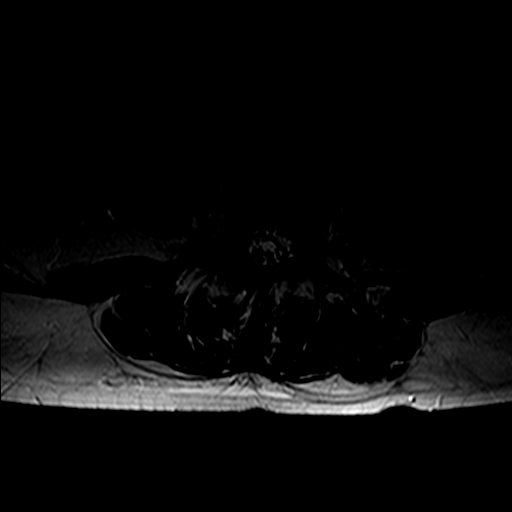
[im 10/14]
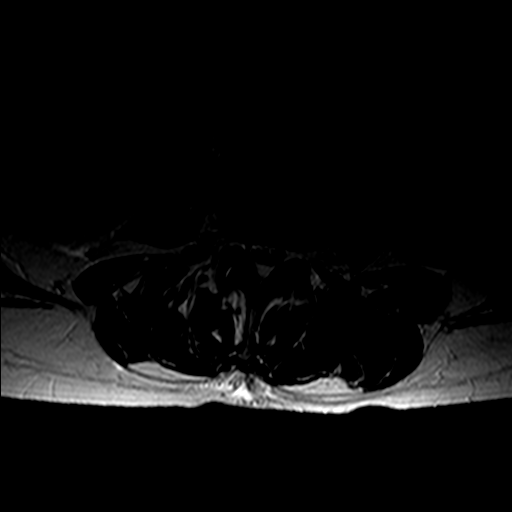
[im 12/14]
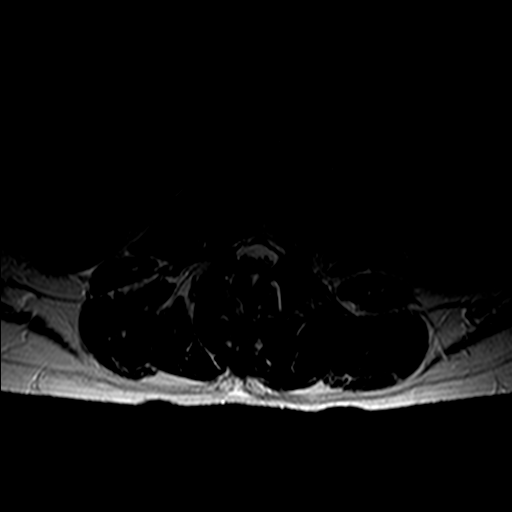
[im 14/14]
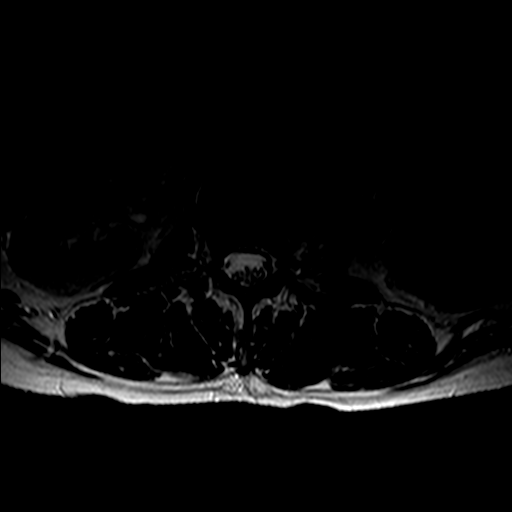

[Series 6: t1_(person_name)_(person_name) · axial · 4.0mm · 0.43mm/px · z∈[-179,-9]mm · 11 of 29 slices shown]
[im 2/29]
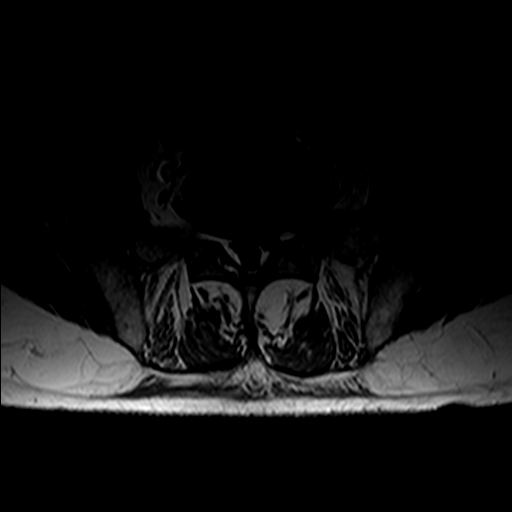
[im 5/29]
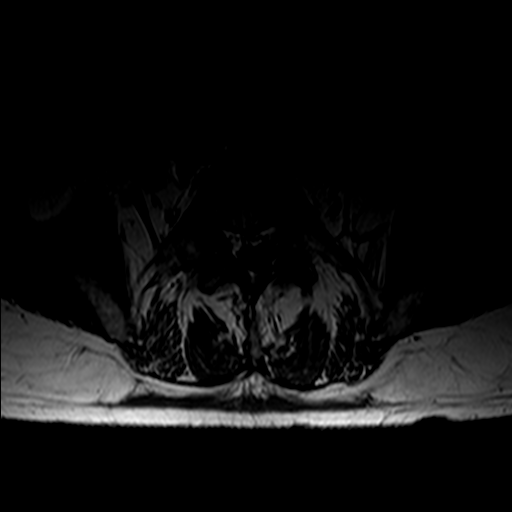
[im 6/29]
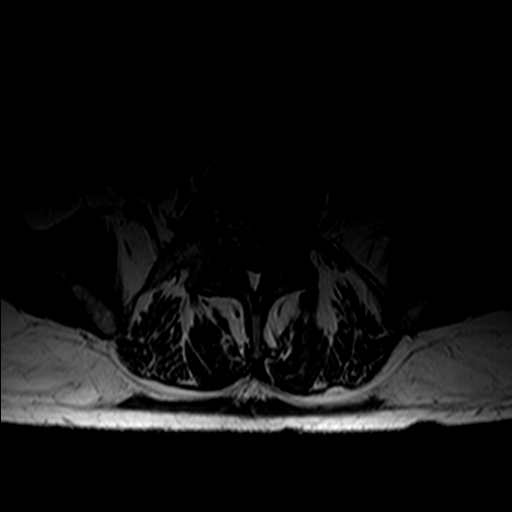
[im 10/29]
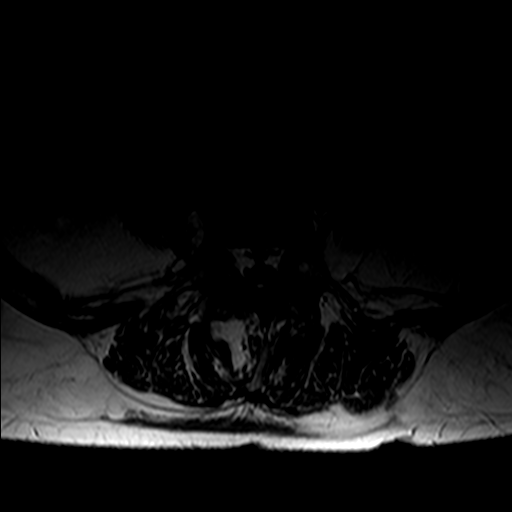
[im 13/29]
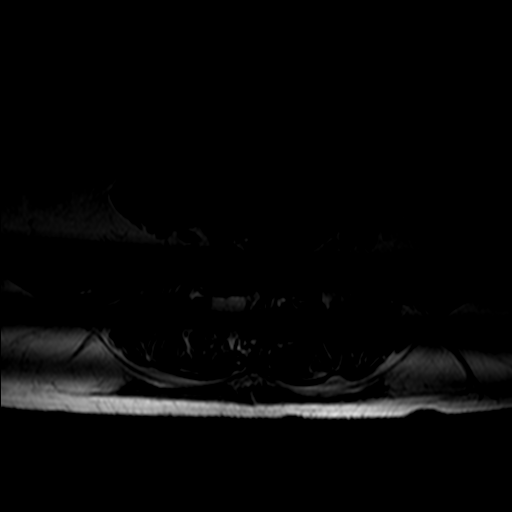
[im 15/29]
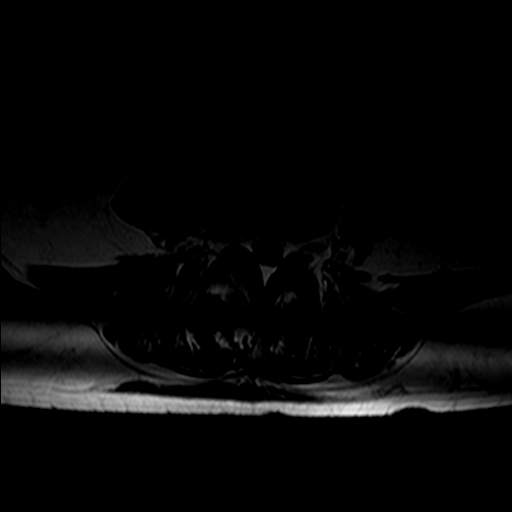
[im 17/29]
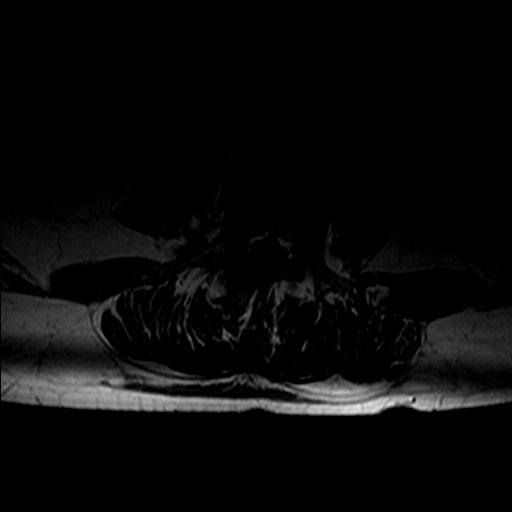
[im 20/29]
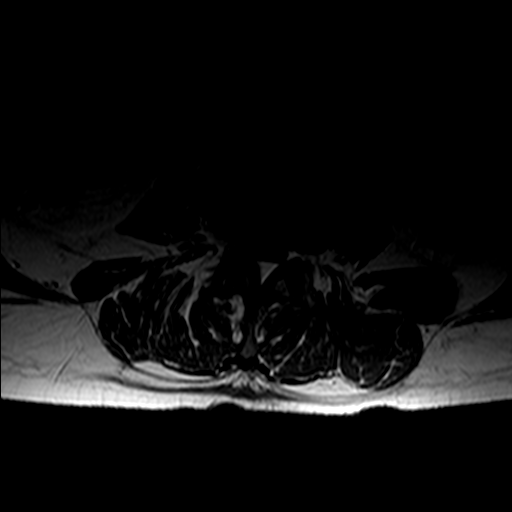
[im 24/29]
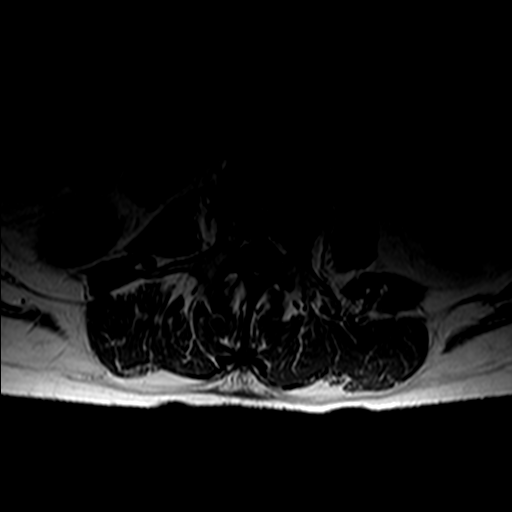
[im 25/29]
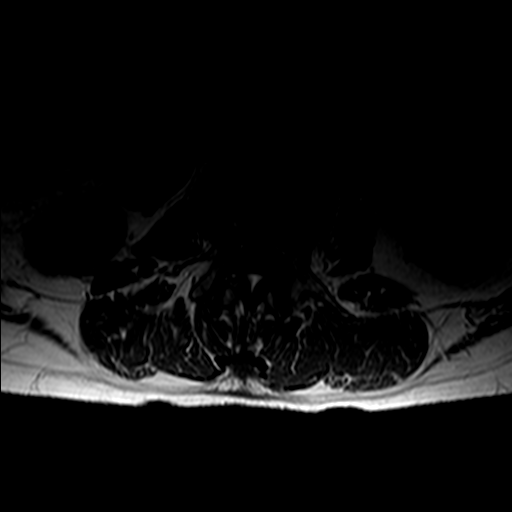
[im 28/29]
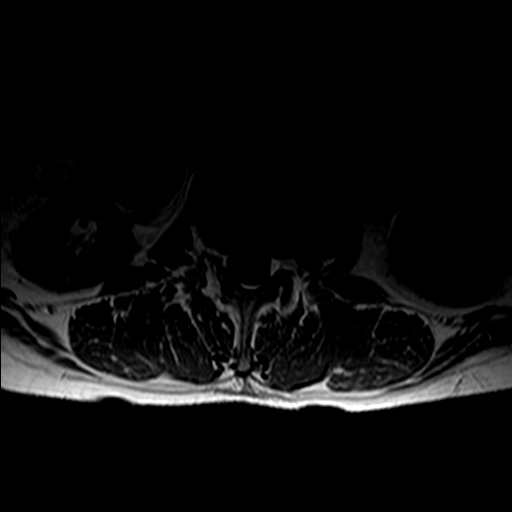

[19 of 43 positions shown; findings below may reference images not displayed]

DIAGNOSTIC STUDIES

EXAM

MRI of the lumbar spine without contrast.

INDICATION

low back pain radiating to right leg
LOW BACK PAIN WITH INITIAL PAIN DOWN RT SIDE. PT HAD SEIZURE WITH FALL AND NOW PAIN DOWN LEFT LEG.
RG

TECHNIQUE

Sagittal axial images were obtained with variable T1 and T2 weighting.

COMPARISONS

None available

FINDINGS

There is levoscoliosis of the mid lumbar spine. Conus medullaris is normal in signal intensity and
location.

L1-2: Loss of height and disc space bulging is noted with spurring of vertebral endplates. There is
mild-to-moderate bilateral neural foraminal stenosis.

L2-3: Disc bulging and facet hypertrophy is noted. There is minimal central canal narrowing and mild
right neural foraminal stenosis.

L3-4: Loss of height and disc bulging is noted. Bilateral facet hypertrophy is noted. There is mild
central canal narrowing and moderate right with mild left neural foraminal stenosis. Schmorl's node
formation is noted superiorly and anteriorly at L4.

L4-5: There is a right posterior paracentral disc protrusion image 8 series 2. This occupies the
right lateral recess resulting in moderate central canal narrowing in conjunction with bilateral
facet hypertrophy. There is minimal left and right neural foraminal stenosis. This likely is
producing a right L5 radiculopathy.

L5-S1: Disc bulging is noted at this level greater to the left than right. In conjunction with facet
hypertrophy there is moderate to severe left and neural foraminal stenosis.

IMPRESSION

Right posterior paracentral disc protrusion at L4-5 occupying the right lateral recess and likely
producing a right L5 radiculopathy. Correlation with clinical symptoms is recommended.

Disc bulging and facet hypertrophy with spurring of vertebral endplates throughout the lumbar spine.
This results in varying degrees of central canal and neural foraminal stenosis. Please see above
discussion for individual levels.

Tech Notes:

LOW BACK PAIN WITH INITIAL PAIN DOWN RT SIDE.  PT HAD SEIZURE WITH FALL AND NOW PAIN DOWN LEFT LEG.
RG

## 2021-10-25 ENCOUNTER — Encounter: Admit: 2021-10-25 | Discharge: 2021-10-25 | Payer: MEDICARE

## 2021-10-25 NOTE — Telephone Encounter
10-25-2021 Per Task Message, requests faxed to Dr Lona Kettle,   (F) [phone], (P) [phone], and [facility], (F) [phone],   (P) [phone], clp      Please request cardiology records from [facility] - [phone]. Per patient had care with Henriette Combs, DO, [facility]      Collect Outside Records Not Started 11/01/2021 Additional records from PCP - Dr. Ozzie Hoyle, Ph - [phone], Fax - [phone]

## 2021-10-28 ENCOUNTER — Encounter: Admit: 2021-10-28 | Discharge: 2021-10-28 | Payer: MEDICARE

## 2021-11-04 ENCOUNTER — Encounter: Admit: 2021-11-04 | Discharge: 2021-11-04 | Payer: MEDICARE

## 2021-11-18 ENCOUNTER — Encounter: Admit: 2021-11-18 | Discharge: 2021-11-18 | Payer: MEDICARE

## 2021-11-18 NOTE — Progress Notes
Records Request    Fate Caster DOB: 07/29/1965    STAT    Medical records request for continuation of care:    Patient has appointment with Dr. Barry Dienes .    Please fax records to [facility] [place] [facility] [phone]    Request records:    Please cloud images of cardiac CT and echo       Thank you,      Cardiovascular Medicine  [facility]  [address]  [place], [place] [address]  Phone:  [phone]  Fax:  [phone]

## 2021-11-19 ENCOUNTER — Encounter: Admit: 2021-11-19 | Discharge: 2021-11-19 | Payer: MEDICARE

## 2021-11-19 DIAGNOSIS — I2510 Atherosclerotic heart disease of native coronary artery without angina pectoris: Secondary | ICD-10-CM

## 2021-11-19 DIAGNOSIS — R0609 Other forms of dyspnea: Secondary | ICD-10-CM

## 2021-11-19 DIAGNOSIS — R9431 Abnormal electrocardiogram [ECG] [EKG]: Secondary | ICD-10-CM

## 2021-11-19 DIAGNOSIS — I4729 NSVT (nonsustained ventricular tachycardia) (HCC): Secondary | ICD-10-CM

## 2021-11-19 DIAGNOSIS — I493 Ventricular premature depolarization: Secondary | ICD-10-CM

## 2021-11-19 DIAGNOSIS — G40309 Generalized idiopathic epilepsy and epileptic syndromes, not intractable, without status epilepticus: Secondary | ICD-10-CM

## 2021-11-19 DIAGNOSIS — Z136 Encounter for screening for cardiovascular disorders: Secondary | ICD-10-CM

## 2021-11-19 NOTE — Patient Instructions
Thank you for visiting our office today.    We would like to make the following medication adjustments:  NONE      Otherwise continue the same medications as you have been doing.          We will be pursuing the following tests after your appointment today:       Orders Placed This Encounter    MRI CARD MORPH W/ MYO MAPPING WO/W    HEMATOCRIT    AMB REFERRAL TO NEUROLOGY    ECG 12-LEAD       ** To Schedule cardiac MRI call [phone]**    Please call us in the meantime with any questions or concerns.        Please allow 5-7 business days for our providers to review your results. All normal results will go to MyChart. If you do not have Mychart, it is strongly recommended to get this so you can easily view all your results. If you do not have mychart, we will attempt to call you once with normal lab and testing results. If we cannot reach you by phone with normal results, we will send you a letter.  If you have not heard the results of your testing after one week please give [place] a call.       Your Cardiovascular Medicine [place] Team Brett Canales, Pilar Jarvis, Shawna Orleans, and [place])  phone number is [phone].

## 2021-11-19 NOTE — Progress Notes
Date of Service: 11/19/2021    Johnathan Reese is a 56 y.o. male.       HPI     Johnathan Reese was in the [facility] clinic today for the purpose of establishing cardiology follow-up on the [place] side.  He has previously been seeing doctors over at [facility] but recently learned that his insurance only covers emergencies on the [place] side.    He was recently diagnosed with a seizure disorder.  He had his most recent seizure on May 26 and at that time was switched from Keppra to Trileptal and since then he has had no further seizures.  He had been seeing a neurologist from [facility].  He is in the process of trying to establish with [facility].    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  He wore a cardiac monitor and had a 16% PVC burden with a few episodes of nonsustained ventricular tachycardia.  None of the arrhythmia is associated with any symptoms at all.  He denies problems with near-syncope or syncope.  He has a little bit of postural lightheadedness at times.    The family history is particularly worrisome for his brothers sudden death at age 66.  The patient thinks that his brother may have had some health issues that he did not disclose to the family.    Johnathan Reese had a coronary calcium scan showing a total score of only 4.  He had CT angiography and he has no significant obstructive coronary disease.  His echocardiogram was essentially normal.    Cardiac MR had been ordered to evaluate his PVCs but this was not approved by his insurance because it was scheduled to be done in [place].    He denies any problems with exertional chest discomfort or breathlessness.  He has had no TIA or stroke symptoms.         Vitals:    11/19/21 1017   BP: (!) 140/90   BP Source: Arm, Left Upper   Pulse: 76   SpO2: 94%   O2 Device: None (Room air)   PainSc: Zero   Weight: 82.1 kg (181 lb)   Height: 185.4 cm (6' 1)     Body mass index is 23.88 kg/m?[name]     Past Medical History  Patient Active Problem List    Diagnosis Date Noted ? 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 PACs not found. PVC burden 16%.      ? NSVT (nonsustained ventricular tachycardia) (HCC) 11/19/2021   ? Dyspnea on exertion 11/19/2021     - 10/25/21- [facility]- Calcium Score- Total 4-LAD. LA and LV normal is size. No stigmata of prior infarction. Normal pulmonary venous drainage. No thickening or calcifications in the aortic and mitral valves.      ? Abnormal EKG 11/19/2021     - 08/16/21- [facility]- Echo- Normal LV systolic function. No color flow or doppler evidence of hemodynamically significant valvular dysfunction.      ? 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 Tremor 07/31/2021   ? Seizure (HCC) 10/07/2020         Review of Systems   Constitutional: Negative.   HENT: Negative.    Eyes: Negative.    Cardiovascular: Positive for chest pain.   Respiratory: Negative.    Endocrine:  Negative.    Hematologic/Lymphatic: Negative.    Skin: Negative.    Musculoskeletal: Negative.    Gastrointestinal: Negative.    Genitourinary: Negative.    Neurological: Positive for seizures.   Psychiatric/Behavioral: Negative.    Allergic/Immunologic: Negative.        Physical Exam    Physical Exam   General Appearance: no distress   Skin: warm, no ulcers or xanthomas   Digits and Nails: no cyanosis or clubbing   Eyes: conjunctivae and lids normal, pupils are equal and round   Teeth/Gums/Palate: dentition unremarkable, no lesions   Lips & Oral Mucosa: no pallor or cyanosis   Neck Veins: normal JVP , neck veins are not distended   Thyroid: no nodules, masses, tenderness or enlargement   Chest Inspection: chest is normal in appearance   Respiratory Effort: breathing comfortably, no respiratory distress   Auscultation/Percussion: lungs clear to auscultation, no rales or rhonchi, no wheezing   PMI: PMI not enlarged or displaced   Cardiac Rhythm: regular rhythm and normal rate   Cardiac Auscultation: S1, S2 normal, no rub, no gallop   Murmurs: no murmur   Peripheral Circulation: normal peripheral circulation   Carotid Arteries: normal carotid upstroke bilaterally, no bruits   Radial Arteries: normal symmetric radial pulses   Abdominal Aorta: no abdominal aortic bruit   Pedal Pulses: normal symmetric pedal pulses   Lower Extremity Edema: no lower extremity edema   Abdominal Exam: soft, non-tender, no masses, bowel sounds normal   Liver & Spleen: no organomegaly   Gait & Station: walks without assistance   Muscle Strength: normal muscle tone   Orientation: oriented to time, place and person   Affect & Mood: appropriate and sustained affect   Language and Memory: patient responsive and seems to comprehend information   Neurologic Exam: neurological assessment grossly intact   Other: moves all extremities    ECG:  SR, rate 78.  One PVC, otherwise normal.    Cardiovascular Health Factors  Vitals BP Readings from Last 3 Encounters:   11/19/21 (!) 140/90     Wt Readings from Last 3 Encounters:   11/19/21 82.1 kg (181 lb)     BMI Readings from Last 3 Encounters:   11/19/21 23.88 kg/m?      Smoking Social History     Tobacco Use   Smoking Status Never   Smokeless Tobacco Never      Lipid Profile No results found for: CHOL  No results found for: HDL  No results found for: LDL  No results found for: TRIG   Blood Sugar No results found for: HGBA1C  Glucose   Date Value Ref Range Status   10/04/2021 92  Final          Problems Addressed Today  Encounter Diagnoses   Name Primary?   ? Screening for heart disease Yes   ? PVC's (premature ventricular contractions)    ? NSVT (nonsustained ventricular tachycardia) (HCC)    ? Dyspnea on exertion    ? Abnormal EKG    ? Generalized convulsive epilepsy (HCC)    ? 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  I will place an order for cardiac MR.  Given the fact that these are asymptomatic and he does not seem to be having any problems with left ventricular dysfunction I am not sure that we necessarily would treat them at this point but we will start with the MR to at least come up with an etiology.    Generalized convulsive epilepsy (HCC)  I placed a consult request for one of the seizure specialists in the neurology department at [facility].  He seems to be tolerating the current antiseizure medication.    Coronary artery calcification  He has no significant obstructive coronary disease by CT angiography.  There are no symptoms to suggest cardiac ischemia and there was no evidence of prior myocardial infarction to explain the frequent PVCs.        Current Medications (including today's revisions)  ? celecoxib (CELEBREX) 50 mg capsule Take one capsule by mouth twice daily.   ? gabapentin (NEURONTIN) 300 mg capsule Take one capsule by mouth three times daily.   ? omeprazole DR (PRILOSEC) 20 mg capsule Take one capsule by mouth daily before breakfast.   ? OXcarbazepine (TRILEPTAL) 300 mg tablet Take two tablets by mouth twice daily.     Total time spent on today's office visit was 45 minutes.  This includes face-to-face in person visit with patient as well as nonface-to-face time including review of the EMR, outside records, labs, radiologic studies, echocardiogram & other cardiovascular studies, formation of treatment plan, after visit summary, future disposition, and lastly on documentation.

## 2021-11-19 NOTE — Assessment & Plan Note
He has no significant obstructive coronary disease by CT angiography.  There are no symptoms to suggest cardiac ischemia and there was no evidence of prior myocardial infarction to explain the frequent PVCs.

## 2021-11-19 NOTE — Assessment & Plan Note
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  I will place an order for cardiac MR.  Given the fact that these are asymptomatic and he does not seem to be having any problems with left ventricular dysfunction I am not sure that we necessarily would treat them at this point but we will start with the MR to at least come up with an etiology.

## 2021-11-19 NOTE — Assessment & Plan Note
I placed a consult request for one of the seizure specialists in the neurology department at [facility].  He seems to be tolerating the current antiseizure medication.

## 2022-01-06 ENCOUNTER — Encounter: Admit: 2022-01-06 | Discharge: 2022-01-06 | Payer: MEDICARE

## 2022-01-12 ENCOUNTER — Encounter: Admit: 2022-01-12 | Discharge: 2022-01-12 | Payer: MEDICARE

## 2022-01-14 ENCOUNTER — Encounter: Admit: 2022-01-14 | Discharge: 2022-01-14 | Payer: MEDICARE

## 2022-01-14 ENCOUNTER — Ambulatory Visit: Admit: 2022-01-14 | Discharge: 2022-01-14 | Payer: MEDICARE

## 2022-01-14 DIAGNOSIS — R0609 Other forms of dyspnea: Secondary | ICD-10-CM

## 2022-01-14 DIAGNOSIS — Z136 Encounter for screening for cardiovascular disorders: Secondary | ICD-10-CM

## 2022-01-14 DIAGNOSIS — R9431 Abnormal electrocardiogram [ECG] [EKG]: Secondary | ICD-10-CM

## 2022-01-14 DIAGNOSIS — I493 Ventricular premature depolarization: Secondary | ICD-10-CM

## 2022-01-14 DIAGNOSIS — I4729 NSVT (nonsustained ventricular tachycardia) (HCC): Secondary | ICD-10-CM

## 2022-01-14 MED ORDER — GADOBENATE DIMEGLUMINE 529 MG/ML (0.1MMOL/0.2ML) IV SOLN
34 mL | Freq: Once | INTRAVENOUS | 0 refills | Status: CP
Start: 2022-01-14 — End: ?
  Administered 2022-01-14: 21:00:00 34 mL via INTRAVENOUS

## 2022-01-15 ENCOUNTER — Encounter: Admit: 2022-01-15 | Discharge: 2022-01-15 | Payer: MEDICARE

## 2022-01-15 ENCOUNTER — Ambulatory Visit: Admit: 2022-01-15 | Discharge: 2022-01-16 | Payer: MEDICARE

## 2022-01-15 DIAGNOSIS — I499 Cardiac arrhythmia, unspecified: Secondary | ICD-10-CM

## 2022-01-15 DIAGNOSIS — I493 Ventricular premature depolarization: Secondary | ICD-10-CM

## 2022-01-15 DIAGNOSIS — Z136 Encounter for screening for cardiovascular disorders: Secondary | ICD-10-CM

## 2022-01-15 DIAGNOSIS — K219 Gastro-esophageal reflux disease without esophagitis: Secondary | ICD-10-CM

## 2022-01-15 DIAGNOSIS — I4729 NSVT (nonsustained ventricular tachycardia) (HCC): Secondary | ICD-10-CM

## 2022-01-15 DIAGNOSIS — R9431 Abnormal electrocardiogram [ECG] [EKG]: Secondary | ICD-10-CM

## 2022-01-15 DIAGNOSIS — M1990 Unspecified osteoarthritis, unspecified site: Secondary | ICD-10-CM

## 2022-01-15 DIAGNOSIS — G4730 Sleep apnea, unspecified: Secondary | ICD-10-CM

## 2022-01-15 DIAGNOSIS — R0609 Other forms of dyspnea: Secondary | ICD-10-CM

## 2022-01-15 DIAGNOSIS — G40919 Epilepsy, unspecified, intractable, without status epilepticus: Secondary | ICD-10-CM

## 2022-01-15 MED ORDER — ONDANSETRON 4 MG PO TBDI
4 mg | ORAL_TABLET | ORAL | 0 refills | 8.00000 days | Status: AC | PRN
Start: 2022-01-15 — End: ?

## 2022-01-15 MED ORDER — OXCARBAZEPINE 300 MG PO TAB
600 mg | ORAL_TABLET | Freq: Two times a day (BID) | ORAL | 3 refills | 33.00000 days | Status: AC
Start: 2022-01-15 — End: ?

## 2022-01-15 NOTE — Progress Notes
Subjective:       History of Present Illness  Johnathan Reese is a 56 y.o. male.  He is accompanied by his sister Johnathan Reese.    SEIZURE DESCRIPTION  Seizure onset:  May 2022    Seizure description: He can feel overall funny prodrome wise.  He then can have generalized stiffening and shaking.  He has no acute warning it will happen.  He denies incontinence or mouth trauma.  He is confused afterwards.    He can have strange smells like a cleaning chemical.  It lasts for long periods though.  He denies strange tastes, deja vu, stomach feelings, or brief anxiety feelings.    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  EEGs have been normal there.  He has recently been transitioned to oxcarbazepine.  I reviewed a note from Dr. 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    He is probably having some vestibular issues due to oxcarbazepine.    Epilepsy risk factors (including gestational/birth history):  The patient was the product of a normal spontaneous vaginal delivery of a full term birth.  There was no history of febrile convulsions or CNS infections.  He does have two brothers with febrile seizures.  Development was normal and developmental milestones were up to par with age.  No history of head trauma with loss of consciousness.  There are no other risk factors for epilepsy.    PREVIOUS TESTS:    1. [facility] EEG (06/25/21 and 10/05/21) - normal (report reviewed)       Review of Systems  The following systems were reviewed and were negative except as mentioned elsewhere:      Constitutional:  No fever/chills/sweat, weight loss, tiredness/fatigue, or poor appetite  Eyes:  No reduced vision or blurriness, double vision, or droopy eyelids  ENT:  No hearing loss, ringing in the ears, vertigo, or hoarseness  Cardiovascular:  No chest pain/angina or palpitations  Respiratory:  No shortness of breath or cough  Gastrointestinal:  No abdominal pain, nausea and/or vomiting, diarrhea, or constipation  Genitourinary:  No pain with urination, excessive urination, or incontinence  Musculoskeletal:  No back pain, neck pain, joint pain/redness/swelling, or myalgia  Skin:  No jaundice, rash, or change in sweating  Hematologic/ Lymphatic:  No anemia, easy bruising, bleeding, or enlarged lymph nodes  Endocrine:  No temperature intolerance, polyuria, or polydipsia  Allergic/ Immunological:  No severe allergic reaction  Psychiatric:  No depression or anxiety    Objective:         ? celecoxib (CELEBREX) 50 mg capsule Take one capsule by mouth twice daily.   ? gabapentin (NEURONTIN) 300 mg capsule Take one capsule by mouth twice daily.   ? omeprazole DR (PRILOSEC) 20 mg capsule Take one capsule by mouth daily before breakfast.   ? OXcarbazepine (TRILEPTAL) 300 mg tablet Take two tablets by mouth twice daily.     Vitals:    01/15/22 1208   BP: 132/80   Pulse: 69   Temp: 36.6 ?C (97.9 ?F)   SpO2: 98%   TempSrc: Temporal   PainSc: Three   Weight: 79.8 kg (176 lb)     Body mass index  is 23.22 kg/m?[name]     Physical Exam  The patient is pleasant and cooperative with the exam.  He is not in acute distress.  The head is normocephalic without evidence of previous trauma.  Oropharynx is clear.  Pulmonary exam reveals unlabored breathing.  Cardiac exam reveals no swelling.  Abdomen is soft.  Pulses are preserved in all four extremities.  Skin exhibits normal temperature and showed no rash.    Mental Status: The patient is attentive, alert, and oriented to person, time and place.  Language comprehension, naming, concentration, and recent/remote memory are intact.  Speech is fluent.    Cranial Nerves: Pupils are equal and reactive to light bilaterally.  Visual fields are intact to confrontation testing.  Extraocular movements are intact without nystagmus.  Facial sensation and strength are normal.  Hearing is intact to finger rub.  Tongue and uvular are in the midline.  Shoulder shrug is normal bilaterally.      Motor: Muscle bulk is normal.  Tone is normal in all extremities.  Motor power is normal in the bilateral deltoids, biceps, triceps, wrist extensors, grip, iliopsoas, quads, hamstrings, gastrocnemius, and tibialis anterior.  There is no pronator drift or fixation.  Tendon reflexes are 2+ diffusely.  Babinski maneuver elicits flexor response bilaterally.     Sensory: Sensation is intact to pinprick, vibration, and light touch.    Cerebellar:  Finger to nose test shows no dysmetria.  Rapid alternating movements are normal.  There is no abnormal movement.      Gait: Normal stance, arm swing, and stride length.  Patient is able to toe, heel, and tandem walk.  No ataxia is noted.       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  There's a smaller chance this isn't epilepsy, but I dont't think that's likely.    We discussed the diagnostic dilemma in that some of this sounds like generalized epilepsy (time of seizure, no aura, brothers with febrile seizures), but age of onset is an issue.  We discussed TLE is most likely statistically, but he has no associated clinical semiology with that either.    We'll proceed with testing as below.  We'll continue his oxcarbazepine.    We discussed his oxcarbazepine could affect his vestibular symptoms so we'll prescribe ondansetron PRN.    EPILEPSY CLASSIFICATION  Epileptogenic zone:   Unknown  Seizure semiology: Bilateral tonic clonic seizure  Etiology:  Unknown  Comorbidities:    Two brothers with febrile seizures    MANAGEMENT AND PLAN  During the visit I discussed my impression, recommended diagnostic studies, prognosis, risks and benefits of management, instructions for management, and importance of compliance.  After a discussion, the patient agrees with the plan.    RECOMMENDATIONS  1. Video EEG - Inpatient video EEG monitoring indicated for one or more of the following reasons: Epilepsy surgery evaluation; reduction of seizure medications during testing; provocation procedures such as photic stimulation, hyperventilation, or sleep deprivation to be performed; seizures occur fewer than 3 times per week if he has another seizure  2. 3T MRI brain with epilepsy protocol.  3. Continue oxcarbazepine 600mg  twice a day  4. Patient tapering off gabapentin per outside physician - used for neuropathy  5. Ondansetron 4mg  PRN air travel    The patient is instructed not to drive unless 6 months free of alteration of consciousness, not to work in close proximity of machines with moving  parts, not to swim unsupervised or to work at high places. The patient is to shower (without accumulation of water) instead of taking a Reese if unsupervised.    FOLLOWUP PLAN  I plan to see the patient back for follow-up in approximately 6 months.  The patient is instructed to contact us if there is an exacerbation of the seizures or any side effects from the antiseizure medications.

## 2022-01-16 DIAGNOSIS — I493 Ventricular premature depolarization: Secondary | ICD-10-CM

## 2022-01-16 DIAGNOSIS — R9431 Abnormal electrocardiogram [ECG] [EKG]: Secondary | ICD-10-CM

## 2022-01-16 DIAGNOSIS — R0609 Other forms of dyspnea: Secondary | ICD-10-CM

## 2022-01-16 DIAGNOSIS — Z136 Encounter for screening for cardiovascular disorders: Secondary | ICD-10-CM

## 2022-01-16 DIAGNOSIS — I4729 Other ventricular tachycardia (HCC): Secondary | ICD-10-CM

## 2022-01-19 ENCOUNTER — Encounter: Admit: 2022-01-19 | Discharge: 2022-01-19 | Payer: MEDICARE

## 2022-01-24 ENCOUNTER — Encounter: Admit: 2022-01-24 | Discharge: 2022-01-24 | Payer: MEDICARE

## 2022-01-24 DIAGNOSIS — I2510 Atherosclerotic heart disease of native coronary artery without angina pectoris: Secondary | ICD-10-CM

## 2022-01-24 DIAGNOSIS — R0609 Other forms of dyspnea: Secondary | ICD-10-CM

## 2022-01-24 DIAGNOSIS — I493 Ventricular premature depolarization: Secondary | ICD-10-CM

## 2022-01-24 NOTE — Telephone Encounter
Called and discussed with patient.  He is agreeable to plan.  Scheduled pt with SDO at [facility] office.  Faxed lab order to [place].  Pt will have drawn prior to appt.  Will callback with any questions, concerns or problems.

## 2022-01-24 NOTE — Telephone Encounter
-----   Message from Michiel Cowboy, MD sent at 01/24/2022 10:27 AM CDT -----  [facility], can you please let Gershon Mussel know that this study does show structural abnormalities that could lead to the frequent PVCs.  I think we're going to need to look into this stuff further.  Can you see if he's had a recent lipid profile and Lp(a) drawn and if not, order them done?  I should probably see him in clinic next time I"m in [place] if possible.  Thanks.    Cc:  DR. Isidore Moos

## 2022-02-04 ENCOUNTER — Encounter: Admit: 2022-02-04 | Discharge: 2022-02-04 | Payer: MEDICARE

## 2022-02-05 ENCOUNTER — Encounter: Admit: 2022-02-05 | Discharge: 2022-02-05 | Payer: MEDICARE

## 2022-02-05 DIAGNOSIS — R0609 Other forms of dyspnea: Secondary | ICD-10-CM

## 2022-02-05 DIAGNOSIS — I2510 Atherosclerotic heart disease of native coronary artery without angina pectoris: Secondary | ICD-10-CM

## 2022-02-05 DIAGNOSIS — I493 Ventricular premature depolarization: Secondary | ICD-10-CM

## 2022-02-05 LAB — LIPID PROFILE
CHOLESTEROL/HDL %: 4
CHOLESTEROL: 187
HDL: 45
LDL: 120 — ABNORMAL HIGH (ref <100)
TRIGLYCERIDES: 110
VLDL: 22

## 2022-02-06 ENCOUNTER — Encounter: Admit: 2022-02-06 | Discharge: 2022-02-06 | Payer: MEDICARE

## 2022-02-06 DIAGNOSIS — K219 Gastro-esophageal reflux disease without esophagitis: Secondary | ICD-10-CM

## 2022-02-06 DIAGNOSIS — R569 Unspecified convulsions: Secondary | ICD-10-CM

## 2022-02-06 DIAGNOSIS — G4730 Sleep apnea, unspecified: Secondary | ICD-10-CM

## 2022-02-06 DIAGNOSIS — I499 Cardiac arrhythmia, unspecified: Secondary | ICD-10-CM

## 2022-02-06 DIAGNOSIS — M1990 Unspecified osteoarthritis, unspecified site: Secondary | ICD-10-CM

## 2022-02-06 DIAGNOSIS — R011 Cardiac murmur, unspecified: Secondary | ICD-10-CM

## 2022-02-06 DIAGNOSIS — R079 Chest pain, unspecified: Secondary | ICD-10-CM

## 2022-02-06 DIAGNOSIS — I493 Ventricular premature depolarization: Secondary | ICD-10-CM

## 2022-02-06 DIAGNOSIS — G40919 Epilepsy, unspecified, intractable, without status epilepticus: Secondary | ICD-10-CM

## 2022-02-06 DIAGNOSIS — R0609 Other forms of dyspnea: Secondary | ICD-10-CM

## 2022-02-06 DIAGNOSIS — I4729 NSVT (nonsustained ventricular tachycardia) (HCC): Secondary | ICD-10-CM

## 2022-02-06 DIAGNOSIS — I2510 Atherosclerotic heart disease of native coronary artery without angina pectoris: Secondary | ICD-10-CM

## 2022-02-06 MED ORDER — ATORVASTATIN 20 MG PO TAB
20 mg | ORAL_TABLET | Freq: Every day | ORAL | 3 refills | Status: AC
Start: 2022-02-06 — End: ?

## 2022-02-06 NOTE — Assessment & Plan Note
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  We know that his coronaries have plaque, but no high-grade obstruction because of a recent coronary CTA.  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  It's hard to date, but based on his clinical history it would seem to have been within the past year or so.    This tells me that we need to aggressively manage risk factors, which for him is basically getting his LDL below 70.  His BP is OK, he doesn't have diabetes or HTN.    I will check into the possibility of Phase 2 Cardiac Rehab; he's interested.

## 2022-02-06 NOTE — Assessment & Plan Note
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  They remain asymptomatic.  I will plan to start low-dose carvedilol in the near future; I'm starting the statin and want to start one thing at a time.

## 2022-02-07 ENCOUNTER — Encounter: Admit: 2022-02-07 | Discharge: 2022-02-07 | Payer: MEDICARE

## 2022-02-07 DIAGNOSIS — I2510 Atherosclerotic heart disease of native coronary artery without angina pectoris: Secondary | ICD-10-CM

## 2022-02-07 DIAGNOSIS — R0609 Other forms of dyspnea: Secondary | ICD-10-CM

## 2022-02-07 DIAGNOSIS — I493 Ventricular premature depolarization: Secondary | ICD-10-CM

## 2022-02-17 ENCOUNTER — Ambulatory Visit: Admit: 2022-02-17 | Discharge: 2022-02-17 | Payer: MEDICARE

## 2022-02-17 ENCOUNTER — Encounter: Admit: 2022-02-17 | Discharge: 2022-02-17 | Payer: MEDICARE

## 2022-02-17 DIAGNOSIS — G40919 Epilepsy, unspecified, intractable, without status epilepticus: Secondary | ICD-10-CM

## 2022-02-20 ENCOUNTER — Encounter: Admit: 2022-02-20 | Discharge: 2022-02-20 | Payer: MEDICARE

## 2022-03-03 ENCOUNTER — Encounter: Admit: 2022-03-03 | Discharge: 2022-03-03 | Payer: MEDICARE

## 2022-03-03 NOTE — Telephone Encounter
-----   Message from [name], MD sent at 03/03/2022  3:34 PM CDT -----  No epileptogenic abnormality.    Can you please let Mr. Johnathan Reese know these findings?  Thanks!

## 2022-03-03 NOTE — Telephone Encounter
LM for patient to review MRI results.

## 2022-03-06 ENCOUNTER — Encounter: Admit: 2022-03-06 | Discharge: 2022-03-06 | Payer: MEDICARE

## 2022-03-10 ENCOUNTER — Encounter: Admit: 2022-03-10 | Discharge: 2022-03-10 | Payer: MEDICARE

## 2022-03-11 ENCOUNTER — Encounter: Admit: 2022-03-11 | Discharge: 2022-03-11 | Payer: MEDICARE

## 2022-03-11 DIAGNOSIS — G4733 Obstructive sleep apnea (adult) (pediatric): Secondary | ICD-10-CM

## 2022-03-11 DIAGNOSIS — R079 Chest pain, unspecified: Secondary | ICD-10-CM

## 2022-03-11 DIAGNOSIS — R011 Cardiac murmur, unspecified: Secondary | ICD-10-CM

## 2022-03-11 DIAGNOSIS — G4730 Sleep apnea, unspecified: Secondary | ICD-10-CM

## 2022-03-11 DIAGNOSIS — G40919 Epilepsy, unspecified, intractable, without status epilepticus: Secondary | ICD-10-CM

## 2022-03-11 DIAGNOSIS — I2510 Atherosclerotic heart disease of native coronary artery without angina pectoris: Secondary | ICD-10-CM

## 2022-03-11 DIAGNOSIS — M1990 Unspecified osteoarthritis, unspecified site: Secondary | ICD-10-CM

## 2022-03-11 DIAGNOSIS — I499 Cardiac arrhythmia, unspecified: Secondary | ICD-10-CM

## 2022-03-11 DIAGNOSIS — K219 Gastro-esophageal reflux disease without esophagitis: Secondary | ICD-10-CM

## 2022-03-11 DIAGNOSIS — I493 Ventricular premature depolarization: Secondary | ICD-10-CM

## 2022-03-11 DIAGNOSIS — R0609 Other forms of dyspnea: Secondary | ICD-10-CM

## 2022-03-11 DIAGNOSIS — R569 Unspecified convulsions: Secondary | ICD-10-CM

## 2022-03-11 DIAGNOSIS — I4729 NSVT (nonsustained ventricular tachycardia) (HCC): Secondary | ICD-10-CM

## 2022-03-11 NOTE — Assessment & Plan Note
I encouraged him to go ahead and schedule the EEG monitoring study.

## 2022-03-11 NOTE — Assessment & Plan Note
I will make a referral to [facility] for an initial evaluation for an inspire device.

## 2022-03-11 NOTE — Progress Notes
Date of Service: 03/11/2022    Johnathan Reese is a 56 y.o. male.       HPI     Johnathan Reese was in the [place] clinic today for follow-up regarding LV systolic dysfunction and ventricular ectopy.  I have seen him a couple of times since July and he was initially referred for frequent, asymptomatic PVCs.  He is also in the process of being evaluated for an apparent seizure disorder.  He is being seen most recently by Dr. Ronda Fairly at [facility].  He had MRI-head done earlier this month that apparently did not demonstrate structural substrate for seizure.  EEG monitoring has been recommended but Johnathan Reese has not called to schedule the study yet.    From a cardiac standpoint the picture is a little bit confusing.  We did a cardiac MRI in September and it looked different than the echocardiogram he had done over at [facility] earlier in the summer.  The echocardiogram had looked normal but the MRI suggested an ejection fraction of about 40% with some thinning and hypokinesis involving the inferolateral base.  He had already had coronary CT angiography at [facility] and does not have any significant obstructive coronary disease.  We checked a lipid profile and lipoprotein a looking for evidence of a metabolic disorder that might make him more prone to thrombus formation, but these numbers were either normal or only minimally abnormal.  His LDL was 120 and he does have a little bit of coronary calcification so he got started on atorvastatin about a month ago.    He has some occasional mild palpitation symptoms now but nothing to suggest any syncope or near syncope.  He has a little bit of vestibular dysfunction and if he moves his head too rapidly he gets a little bit of nausea.  He attributes this to the combination of marijuana and Trileptal.    He has not had any chest discomfort.  He has a little bit of breathlessness at times that he attributes to being out of shape.    He does have sleep apnea and has not tolerated the CPAP device.  He expressed interest in the inspire system today.       Vitals:    03/11/22 0838   BP: 128/72   BP Source: Arm, Left Upper   Pulse: 66   SpO2: 99%   O2 Device: None (Room air)   PainSc: Zero   Weight: 76.7 kg (169 lb)   Height: 185.4 cm (6' 1)     Body mass index is 22.3 kg/m?[name]     Past Medical History  Patient Active Problem List    Diagnosis Date Noted   ? OSA (obstructive sleep apnea) 03/11/2022   ? 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 PACs not found. PVC burden 16%.      ? NSVT (nonsustained ventricular tachycardia) (HCC) 11/19/2021   ? Dyspnea on exertion 11/19/2021     - 10/25/21- [facility]- Calcium Score- Total 4-LAD. LA and LV normal is size. No stigmata of prior infarction. Normal pulmonary venous drainage. No thickening or calcifications in the aortic and mitral valves.      ? Abnormal EKG 11/19/2021     - 08/16/21- [facility]- Echo- Normal LV systolic function. No color flow or doppler evidence of hemodynamically  significant valvular dysfunction.      ? Coronary artery disease involving native coronary artery of native heart without angina pectoris 11/19/2021         Review of Systems   Constitutional: Negative.   HENT: Negative.    Eyes: Negative.    Cardiovascular: Negative.    Respiratory: Negative.    Endocrine: Negative.    Hematologic/Lymphatic: Negative.    Skin: Negative.    Musculoskeletal: Negative.    Gastrointestinal: Negative.    Genitourinary: Negative.    Neurological: Negative.    Psychiatric/Behavioral: Negative.    Allergic/Immunologic: Negative.        Physical Exam    Physical Exam   General Appearance: no distress   Skin: warm, no ulcers or xanthomas   Digits and Nails: no cyanosis or clubbing   Eyes: conjunctivae and lids normal, pupils are equal and round Teeth/Gums/Palate: dentition unremarkable, no lesions   Lips & Oral Mucosa: no pallor or cyanosis   Neck Veins: normal JVP , neck veins are not distended   Thyroid: no nodules, masses, tenderness or enlargement   Chest Inspection: chest is normal in appearance   Respiratory Effort: breathing comfortably, no respiratory distress   Auscultation/Percussion: lungs clear to auscultation, no rales or rhonchi, no wheezing   PMI: PMI not enlarged or displaced   Cardiac Rhythm: regular rhythm and normal rate   Cardiac Auscultation: S1, S2 normal, no rub, no gallop   Murmurs: no murmur   Peripheral Circulation: normal peripheral circulation   Carotid Arteries: normal carotid upstroke bilaterally, no bruits   Radial Arteries: normal symmetric radial pulses   Abdominal Aorta: no abdominal aortic bruit   Pedal Pulses: normal symmetric pedal pulses   Lower Extremity Edema: no lower extremity edema   Abdominal Exam: soft, non-tender, no masses, bowel sounds normal   Liver & Spleen: no organomegaly   Gait & Station: walks without assistance   Muscle Strength: normal muscle tone   Orientation: oriented to time, place and person   Affect & Mood: appropriate and sustained affect   Language and Memory: patient responsive and seems to comprehend information   Neurologic Exam: neurological assessment grossly intact   Other: moves all extremities      Cardiovascular Health Factors  Vitals BP Readings from Last 3 Encounters:   03/11/22 128/72   02/06/22 128/80   01/15/22 132/80     Wt Readings from Last 3 Encounters:   03/11/22 76.7 kg (169 lb)   02/06/22 77.3 kg (170 lb 6.4 oz)   01/15/22 79.8 kg (176 lb)     BMI Readings from Last 3 Encounters:   03/11/22 22.30 kg/m?   02/06/22 22.48 kg/m?   01/15/22 23.22 kg/m?      Smoking Social History     Tobacco Use   Smoking Status Never   ? Passive exposure: Never   Smokeless Tobacco Never      Lipid Profile Cholesterol   Date Value Ref Range Status   02/05/2022 187  Final     HDL   Date Value Ref Range Status   02/05/2022 45  Final     LDL   Date Value Ref Range Status   02/05/2022 120 (H) <100 Final     Triglycerides   Date Value Ref Range Status   02/05/2022 110  Final      Blood Sugar No results found for: HGBA1C  Glucose   Date Value Ref Range Status   10/04/2021 92  Final  Problems Addressed Today  Encounter Diagnoses   Name Primary?   ? Sleep apnea, unspecified type Yes   ? Coronary artery disease involving native coronary artery of native heart without angina pectoris    ? PVC's (premature ventricular contractions)    ? NSVT (nonsustained ventricular tachycardia) (HCC)    ? Intractable epilepsy without status epilepticus, unspecified epilepsy type (HCC)    ? Dyspnea on exertion    ? OSA (obstructive sleep apnea)        Assessment and Plan       Coronary artery disease involving native coronary artery of native heart without angina pectoris  I do not really think coronary disease is responsible for his regional wall motion abnormality.  The more I look at his picture the more I wonder whether he had some sort of COVID induced endothelial dysfunction to result in some transient occlusion of a coronary artery with some residual scar that serves as a PVC focus.        PVC's (premature ventricular contractions)  As long as his PVCs are not particularly symptomatic I will probably plan to see him back in about 6 months with another monitor at that point.  I will repeat an echocardiogram then as well.    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    OSA (obstructive sleep apnea)  I will make a referral to [facility] for an initial evaluation for an inspire device.      Current Medications (including today's revisions)  ? atorvastatin (LIPITOR) 20 mg tablet Take one tablet by mouth daily.   ? celecoxib (CELEBREX) 50 mg capsule Take one capsule by mouth twice daily.   ? omeprazole DR (PRILOSEC) 20 mg capsule Take one capsule by mouth daily before breakfast.   ? ondansetron (ZOFRAN ODT) 4 mg rapid dissolve tablet Dissolve one tablet by mouth every 8 hours as needed for Nausea or Vomiting. Place on tongue to dissolve.   ? OXcarbazepine (TRILEPTAL) 300 mg tablet Take two tablets by mouth twice daily.     Total time spent on today's office visit was 30 minutes.  This includes face-to-face in person visit with patient as well as nonface-to-face time including review of the EMR, outside records, labs, radiologic studies, echocardiogram & other cardiovascular studies, formation of treatment plan, after visit summary, future disposition, and lastly on documentation.

## 2022-03-11 NOTE — Assessment & Plan Note
As long as his PVCs are not particularly symptomatic I will probably plan to see him back in about 6 months with another monitor at that point.  I will repeat an echocardiogram then as well.

## 2022-03-11 NOTE — Assessment & Plan Note
I do not really think coronary disease is responsible for his regional wall motion abnormality.  The more I look at his picture the more I wonder whether he had some sort of COVID induced endothelial dysfunction to result in some transient occlusion of a coronary artery with some residual scar that serves as a PVC focus.

## 2022-03-12 ENCOUNTER — Encounter: Admit: 2022-03-12 | Discharge: 2022-03-12 | Payer: MEDICARE

## 2022-03-27 ENCOUNTER — Encounter: Admit: 2022-03-27 | Discharge: 2022-03-27 | Payer: MEDICARE

## 2022-03-27 DIAGNOSIS — G40919 Epilepsy, unspecified, intractable, without status epilepticus: Secondary | ICD-10-CM

## 2022-04-07 ENCOUNTER — Encounter: Admit: 2022-04-07 | Discharge: 2022-04-07 | Payer: MEDICARE

## 2022-04-09 ENCOUNTER — Encounter: Admit: 2022-04-09 | Discharge: 2022-04-09 | Payer: MEDICARE

## 2022-04-09 NOTE — Telephone Encounter
I spoke with Dr. Chriss Driver at an ED.  He tells me Johnathan Reese has had two seizures in the last two days.  We discussed starting zonsiamide 100mg  qHS and increase to 200mg  qHS after two weeks.  Dr. expressed agreement with this plan.

## 2022-04-11 ENCOUNTER — Encounter: Admit: 2022-04-11 | Discharge: 2022-04-11 | Payer: MEDICARE

## 2022-04-17 ENCOUNTER — Encounter: Admit: 2022-04-17 | Discharge: 2022-04-17 | Payer: MEDICARE

## 2022-04-18 ENCOUNTER — Encounter: Admit: 2022-04-18 | Discharge: 2022-04-18 | Payer: MEDICARE

## 2022-04-19 ENCOUNTER — Encounter: Admit: 2022-04-19 | Discharge: 2022-04-19 | Payer: MEDICARE

## 2022-04-24 ENCOUNTER — Encounter: Admit: 2022-04-24 | Discharge: 2022-04-24 | Payer: MEDICARE

## 2022-04-27 ENCOUNTER — Encounter: Admit: 2022-04-27 | Discharge: 2022-04-27 | Payer: MEDICARE

## 2022-04-28 ENCOUNTER — Encounter: Admit: 2022-04-28 | Discharge: 2022-04-28 | Payer: MEDICARE

## 2022-05-07 ENCOUNTER — Encounter: Admit: 2022-05-07 | Discharge: 2022-05-07 | Payer: MEDICARE

## 2022-05-11 ENCOUNTER — Encounter: Admit: 2022-05-11 | Discharge: 2022-05-11 | Payer: MEDICARE

## 2022-05-13 ENCOUNTER — Encounter: Admit: 2022-05-13 | Discharge: 2022-05-13 | Payer: MEDICARE

## 2022-05-13 MED ORDER — OXCARBAZEPINE 300 MG PO TAB
600 mg | ORAL_TABLET | Freq: Two times a day (BID) | ORAL | 0 refills | 33.00000 days | Status: AC
Start: 2022-05-13 — End: ?

## 2022-05-19 ENCOUNTER — Encounter: Admit: 2022-05-19 | Discharge: 2022-05-19 | Payer: MEDICARE

## 2022-05-26 ENCOUNTER — Encounter: Admit: 2022-05-26 | Discharge: 2022-05-26 | Payer: MEDICARE

## 2022-05-29 ENCOUNTER — Inpatient Hospital Stay: Payer: BC Managed Care – HMO

## 2022-05-29 DIAGNOSIS — G40919 Epilepsy, unspecified, intractable, without status epilepticus: Secondary | ICD-10-CM

## 2022-06-03 ENCOUNTER — Encounter: Admit: 2022-06-03 | Discharge: 2022-06-03 | Payer: BC Managed Care – HMO

## 2022-06-17 ENCOUNTER — Encounter: Admit: 2022-06-17 | Discharge: 2022-06-17 | Payer: BC Managed Care – HMO

## 2022-06-17 NOTE — Telephone Encounter
Patient verified using 2 patient identifiers.  Date and time of admission: 06/22/22 at 8 AM    Confirmation of admission for VEEG: Yes. Confirmed by pt    Has there been any hospital admissions, emergency room visits, surgeries or illnesses in past 3 months? Yes. 04/09/22 at [facility] ER for seizures    Has there been any issues with anxiety, depression or any issues  that may make it difficult to stay in hospital room for the duration of stay? No. Pt informed that he will not be able to leave the room for duration of study. Pt verbalized understanding and stated that this is not a problem for him.    Discussed with pt/guardian the following: Arrive at scheduled time to Admissions Office at [place] located on the first floor. Come with clean hair, shampoo only, no use of conditioner, mousse, gel, no braids, extensions, corn rows or dread locks. Bring all medications in most recent original medication bottle that you receive from pharmacy. Take all medication as prescribed on day of admission including seizure medication: Yes    Discussed with pt/guardian that while in hospital they will be fall risk and must have nursing person assist when out of bed: Yes    Questions or concerns regarding admission for VEEG? Yes Answered pt's questions; went over the Sunday admission workflow; reminded re: correct location. Informed pt that VEEG handout will also be sent via MyChart. Pt verbalized understanding.

## 2022-06-18 ENCOUNTER — Encounter: Admit: 2022-06-18 | Discharge: 2022-06-18 | Payer: BC Managed Care – HMO

## 2022-06-22 ENCOUNTER — Encounter: Admit: 2022-06-22 | Discharge: 2022-06-22 | Payer: BC Managed Care – HMO

## 2022-06-23 ENCOUNTER — Encounter: Admit: 2022-06-23 | Discharge: 2022-06-23 | Payer: BC Managed Care – HMO

## 2022-06-23 DIAGNOSIS — G4730 Sleep apnea, unspecified: Secondary | ICD-10-CM

## 2022-06-23 DIAGNOSIS — R011 Cardiac murmur, unspecified: Secondary | ICD-10-CM

## 2022-06-23 DIAGNOSIS — R569 Unspecified convulsions: Secondary | ICD-10-CM

## 2022-06-23 DIAGNOSIS — I499 Cardiac arrhythmia, unspecified: Secondary | ICD-10-CM

## 2022-06-23 DIAGNOSIS — K219 Gastro-esophageal reflux disease without esophagitis: Secondary | ICD-10-CM

## 2022-06-23 DIAGNOSIS — M1990 Unspecified osteoarthritis, unspecified site: Secondary | ICD-10-CM

## 2022-06-23 DIAGNOSIS — R079 Chest pain, unspecified: Secondary | ICD-10-CM

## 2022-06-23 MED ADMIN — ATORVASTATIN 20 MG PO TAB [77412]: 20 mg | ORAL | @ 03:00:00 | NDC 67877051290

## 2022-06-23 MED ADMIN — OXCARBAZEPINE 300 MG PO TAB [78916]: 600 mg | ORAL | @ 03:00:00 | NDC 00904726361

## 2022-06-23 MED ADMIN — PANTOPRAZOLE 20 MG PO TBEC [79463]: 20 mg | ORAL | @ 03:00:00 | NDC 60687058511

## 2022-06-24 MED ADMIN — PANTOPRAZOLE 20 MG PO TBEC [79463]: 20 mg | ORAL | @ 03:00:00 | NDC 60687058511

## 2022-06-24 MED ADMIN — ATORVASTATIN 20 MG PO TAB [77412]: 20 mg | ORAL | @ 03:00:00 | NDC 67877051290

## 2022-06-25 MED ADMIN — PANTOPRAZOLE 20 MG PO TBEC [79463]: 20 mg | ORAL | @ 03:00:00 | NDC 60687058511

## 2022-06-25 MED ADMIN — ATORVASTATIN 20 MG PO TAB [77412]: 20 mg | ORAL | @ 03:00:00 | NDC 67877051290

## 2022-06-26 MED ADMIN — ATORVASTATIN 20 MG PO TAB [77412]: 20 mg | ORAL | @ 02:00:00 | NDC 67877051290

## 2022-06-26 MED ADMIN — ACETAMINOPHEN 325 MG PO TAB [101]: 650 mg | ORAL | @ 19:00:00 | NDC 00904677361

## 2022-06-26 MED ADMIN — PANTOPRAZOLE 20 MG PO TBEC [79463]: 20 mg | ORAL | @ 02:00:00 | NDC 60687058511

## 2022-06-27 ENCOUNTER — Encounter: Admit: 2022-06-27 | Discharge: 2022-06-27 | Payer: BC Managed Care – HMO

## 2022-06-27 MED ADMIN — PANTOPRAZOLE 20 MG PO TBEC [79463]: 20 mg | ORAL | @ 03:00:00 | NDC 60687058511

## 2022-06-27 MED ADMIN — ATORVASTATIN 20 MG PO TAB [77412]: 20 mg | ORAL | @ 03:00:00 | NDC 67877051290

## 2022-06-28 MED ADMIN — PANTOPRAZOLE 20 MG PO TBEC [79463]: 20 mg | ORAL | @ 02:00:00 | NDC 60687058511

## 2022-06-28 MED ADMIN — ATORVASTATIN 20 MG PO TAB [77412]: 20 mg | ORAL | @ 02:00:00 | NDC 67877051290

## 2022-06-29 ENCOUNTER — Encounter: Admit: 2022-06-29 | Discharge: 2022-06-29 | Payer: BC Managed Care – HMO

## 2022-06-29 MED ADMIN — ATORVASTATIN 20 MG PO TAB [77412]: 20 mg | ORAL | @ 03:00:00 | NDC 67877051290

## 2022-06-29 MED ADMIN — OXCARBAZEPINE 300 MG PO TAB [78916]: 600 mg | ORAL | @ 03:00:00 | NDC 00904726361

## 2022-06-29 MED ADMIN — CLORAZEPATE DIPOTASSIUM 3.75 MG PO TAB [1759]: 3.75 mg | ORAL | @ 03:00:00 | NDC 13107031901

## 2022-06-29 MED ADMIN — OXCARBAZEPINE 300 MG PO TAB [78916]: 600 mg | ORAL | @ 15:00:00 | Stop: 2022-06-29 | NDC 00904726361

## 2022-06-29 MED ADMIN — PANTOPRAZOLE 20 MG PO TBEC [79463]: 20 mg | ORAL | @ 03:00:00 | NDC 60687058511

## 2022-06-29 MED FILL — CLORAZEPATE DIPOTASSIUM 3.75 MG PO TAB: 3.75 mg | ORAL | 3 days supply | Qty: 3 | Fill #1 | Status: CP

## 2022-06-30 ENCOUNTER — Encounter: Admit: 2022-06-30 | Discharge: 2022-06-30 | Payer: BC Managed Care – HMO

## 2022-06-30 DIAGNOSIS — G40919 Epilepsy, unspecified, intractable, without status epilepticus: Secondary | ICD-10-CM

## 2022-06-30 DIAGNOSIS — F32A Depression, unspecified depression type: Secondary | ICD-10-CM

## 2022-06-30 DIAGNOSIS — G479 Sleep disorder, unspecified: Secondary | ICD-10-CM

## 2022-07-01 ENCOUNTER — Encounter: Admit: 2022-07-01 | Discharge: 2022-07-01 | Payer: BC Managed Care – HMO

## 2022-07-04 ENCOUNTER — Encounter: Admit: 2022-07-04 | Discharge: 2022-07-04 | Payer: BC Managed Care – HMO

## 2022-07-07 ENCOUNTER — Encounter: Admit: 2022-07-07 | Discharge: 2022-07-07 | Payer: BC Managed Care – HMO

## 2022-07-14 ENCOUNTER — Encounter: Admit: 2022-07-14 | Discharge: 2022-07-14 | Payer: BC Managed Care – HMO

## 2022-07-16 ENCOUNTER — Encounter: Admit: 2022-07-16 | Discharge: 2022-07-16 | Payer: BC Managed Care – HMO

## 2022-07-17 ENCOUNTER — Encounter: Admit: 2022-07-17 | Discharge: 2022-07-17 | Payer: BC Managed Care – HMO

## 2022-07-18 ENCOUNTER — Encounter: Admit: 2022-07-18 | Discharge: 2022-07-18 | Payer: BC Managed Care – HMO

## 2022-07-18 ENCOUNTER — Inpatient Hospital Stay: Payer: BC Managed Care – HMO

## 2022-07-18 DIAGNOSIS — G40919 Epilepsy, unspecified, intractable, without status epilepticus: Secondary | ICD-10-CM

## 2022-07-21 ENCOUNTER — Encounter: Admit: 2022-07-21 | Discharge: 2022-07-21 | Payer: BC Managed Care – HMO

## 2022-07-22 ENCOUNTER — Encounter: Admit: 2022-07-22 | Discharge: 2022-07-22 | Payer: BC Managed Care – HMO

## 2022-07-29 ENCOUNTER — Encounter: Admit: 2022-07-29 | Discharge: 2022-07-29 | Payer: BC Managed Care – HMO

## 2022-07-29 ENCOUNTER — Ambulatory Visit: Admit: 2022-07-29 | Discharge: 2022-07-30 | Payer: BC Managed Care – HMO

## 2022-07-29 DIAGNOSIS — M1990 Unspecified osteoarthritis, unspecified site: Secondary | ICD-10-CM

## 2022-07-29 DIAGNOSIS — G4730 Sleep apnea, unspecified: Secondary | ICD-10-CM

## 2022-07-29 DIAGNOSIS — K219 Gastro-esophageal reflux disease without esophagitis: Secondary | ICD-10-CM

## 2022-07-29 DIAGNOSIS — I499 Cardiac arrhythmia, unspecified: Secondary | ICD-10-CM

## 2022-07-29 DIAGNOSIS — R569 Unspecified convulsions: Secondary | ICD-10-CM

## 2022-07-29 DIAGNOSIS — G40919 Epilepsy, unspecified, intractable, without status epilepticus: Secondary | ICD-10-CM

## 2022-07-29 DIAGNOSIS — R011 Cardiac murmur, unspecified: Secondary | ICD-10-CM

## 2022-07-29 DIAGNOSIS — R079 Chest pain, unspecified: Secondary | ICD-10-CM

## 2022-07-29 DIAGNOSIS — R519 Generalized headaches: Secondary | ICD-10-CM

## 2022-07-29 NOTE — Patient Instructions
Day minus 3 - Take oxcarbazepine 600mg  at night  Day minus 2 - Take oxcarbazepine 600mg  at night  Day minus 1 - Stop oxcarbazepine  Day zero - Admit to the hospital

## 2022-07-29 NOTE — Progress Notes
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  In the interim, he had work up below that was unrevealing.    When I spoke with Johnathan Reese, he tells me he had one seizure on 07/15/22.  He was ordered for ambulatory video EEG, but his insurance did not work with that company.  He's scheduled for repeat EMU in April 2024.    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, insomnia (report reviewed)       Review of systems  The following systems were reviewed and were negative except as mentioned elsewhere:      Constitutional:  No fever/chills/sweat, weight loss, tiredness/fatigue, or poor appetite  Eyes:  No reduced vision or blurriness, double vision, or droopy eyelids  ENT:  No hearing loss, ringing in the ears, vertigo, or hoarseness  Cardiovascular:  No chest pain/angina or palpitations  Respiratory:  No shortness of breath or cough  Gastrointestinal:  No abdominal pain, nausea and/or vomiting, diarrhea, or constipation  Genitourinary:  No pain with urination, excessive urination, or incontinence  Musculoskeletal:  No back pain, neck pain, joint pain/redness/swelling, or myalgia  Skin:  No jaundice, rash, or change in sweating  Hematologic/ Lymphatic:  No anemia, easy bruising, bleeding, or enlarged lymph nodes  Endocrine:  No temperature intolerance, polyuria, or polydipsia  Allergic/ Immunological:  No severe allergic reaction  Psychiatric:  No depression or anxiety    Current Outpatient Medications on File Prior to Visit   Medication Sig Dispense Refill    acetaminophen SR (TYLENOL 8 HOUR) 650 mg tablet Take one tablet by mouth every 8 hours as needed for Pain.      atorvastatin (LIPITOR) 20 mg tablet Take one tablet by mouth daily. 90 tablet 3    celecoxib (CELEBREX) 50 mg capsule Take one capsule by mouth daily.      omeprazole DR (PRILOSEC) 20 mg capsule Take one capsule by mouth daily before breakfast.      ondansetron (ZOFRAN ODT) 4 mg rapid dissolve tablet Dissolve one tablet by mouth every 8 hours as needed for Nausea or Vomiting. Place on tongue to dissolve. 30 tablet 0    OXcarbazepine (TRILEPTAL) 300 mg tablet Take two tablets by mouth twice daily. 180 tablet 3     No current facility-administered medications on file prior to visit.       Exam  General appearance: He is not in acute distress.    HEENT: The head is normocephalic without evidence of previous trauma.  Oropharynx is without obvious lesion.  Eyes: Normal sclera  Neck: Full range of motion  Pulmonary: Notable for unlabored breathing.    Skin: Exhibits normal temperature and showed no rash.    Extremities: No edema is noted.  Psychiatric: Normal thought process and appropriate affect    NEUROLOGICAL EXAM  Mental Status: The patient is attentive, alert, and oriented to person, time and place.  Language comprehension, naming, concentration, and recent/remote memory are intact.  Speech is fluent.    Cranial Nerves: Pupils are equal.  Visual fields are grossly intact.  Extraocular movements are intact without nystagmus.  Facial strength is normal.  Hearing is intact to conversation.  Tongue and uvular are in the midline.  Shoulder  shrug is normal bilaterally.      Motor: Muscle bulk is normal.  Motor power is at least 3/5 in the bilateral deltoids, biceps, triceps, wrist extensors, grip, iliopsoas, quads, hamstrings, gastrocnemius, and tibialis anterior.    Sensory: Unable to assess    Cerebellar:  There is no abnormal movement.      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  We'll proceed with the EMU testing with pretitration off oxcarbazepine.    For his dizziness and long LOC, I'd have him see his primary doctor or cardiologist to ensure there is no cardiac etiology, particularly given his cardiac history.    EPILEPSY CLASSIFICATION  Epileptogenic zone:   Unknown  Seizure semiology: Bilateral tonic clonic seizure  Etiology:  Unknown  Comorbidities:    Two brothers with febrile seizures  MANAGEMENT AND PLAN  During the visit I discussed my impression, recommended diagnostic studies, prognosis, risks and benefits of management, instructions for management, and importance of compliance.  After a discussion, the patient agrees with the plan.  Total time for this telehealth with video visit was 30 minutes.    RECOMMENDATIONS  1. Video EEG - Inpatient video EEG monitoring indicated for one or more of the following reasons: Epilepsy surgery evaluation; reduction of seizure medications during testing; provocation procedures such as photic stimulation, hyperventilation, or sleep deprivation to be performed; seizures occur fewer than 3 times per week    2. Continue oxcarbazepine 600mg  2x daily - plan on pretitration prior to EMU - 3 days prior go to oxcarbazepine 600mg  qHS and one day prior to admit stop oxcarbazepine    3. See primary doctor about lightheadedness, particularly in the context of his cardiac disease    The patient is instructed not to drive unless 6 months free of alteration of consciousness, not to work in close proximity of machines with moving parts, not to swim unsupervised or to work at high places. The patient is to shower (without accumulation of water) instead of taking a bath if unsupervised.    FOLLOWUP PLAN  I plan to see the patient back for follow-up in approximately 4 months with NP and 8 months with me.  The patient is instructed to contact us if there is an exacerbation of the seizures or any side effects from the antiseizure medications.       This is a telemedicine visit that was performed with the originating site at [facility] and the distant site at his home.  Verbal consent to participate in telehealth visit was obtained. I discussed with the patient the nature of our telemedicine visits, that:     I would evaluate the patient and recommend diagnostics and treatments based on my assessment   Our sessions are not being recorded and that personal health information is protected   Our team would provide follow up care in person if/when the patient needs it

## 2022-08-12 ENCOUNTER — Encounter: Admit: 2022-08-12 | Discharge: 2022-08-12 | Payer: BC Managed Care – HMO

## 2022-08-13 ENCOUNTER — Encounter: Admit: 2022-08-13 | Discharge: 2022-08-13 | Payer: BC Managed Care – HMO

## 2022-08-18 ENCOUNTER — Encounter: Admit: 2022-08-18 | Discharge: 2022-08-18 | Payer: BC Managed Care – HMO

## 2022-08-18 MED ADMIN — ENOXAPARIN 40 MG/0.4 ML SC SYRG [85052]: 40 mg | SUBCUTANEOUS | @ 02:00:00 | NDC 00781324602

## 2022-08-18 MED ADMIN — ATORVASTATIN 20 MG PO TAB [77412]: 20 mg | ORAL | @ 14:00:00 | NDC 67877051290

## 2022-08-18 MED ADMIN — PANTOPRAZOLE 40 MG PO TBEC [80436]: 40 mg | ORAL | @ 02:00:00 | NDC 00904647461

## 2022-08-19 MED ADMIN — ENOXAPARIN 40 MG/0.4 ML SC SYRG [85052]: 40 mg | SUBCUTANEOUS | @ 01:00:00 | NDC 00781324602

## 2022-08-19 MED ADMIN — ATORVASTATIN 20 MG PO TAB [77412]: 20 mg | ORAL | @ 14:00:00 | NDC 67877051290

## 2022-08-19 MED ADMIN — PANTOPRAZOLE 40 MG PO TBEC [80436]: 40 mg | ORAL | @ 01:00:00 | NDC 00904647461

## 2022-08-20 MED ADMIN — ATORVASTATIN 20 MG PO TAB [77412]: 20 mg | ORAL | @ 15:00:00 | NDC 67877051290

## 2022-08-20 MED ADMIN — ENOXAPARIN 40 MG/0.4 ML SC SYRG [85052]: 40 mg | SUBCUTANEOUS | @ 02:00:00 | NDC 00781324602

## 2022-08-20 MED ADMIN — PANTOPRAZOLE 40 MG PO TBEC [80436]: 40 mg | ORAL | @ 02:00:00 | NDC 00904647461

## 2022-08-21 ENCOUNTER — Encounter: Admit: 2022-08-21 | Discharge: 2022-08-21 | Payer: BC Managed Care – HMO

## 2022-08-21 MED ADMIN — ATORVASTATIN 20 MG PO TAB [77412]: 20 mg | ORAL | @ 14:00:00 | NDC 67877051290

## 2022-08-21 MED ADMIN — PANTOPRAZOLE 40 MG PO TBEC [80436]: 40 mg | ORAL | @ 01:00:00 | NDC 00904647461

## 2022-08-21 MED ADMIN — ENOXAPARIN 40 MG/0.4 ML SC SYRG [85052]: 40 mg | SUBCUTANEOUS | @ 01:00:00 | NDC 00781324602

## 2022-08-22 MED ADMIN — PANTOPRAZOLE 40 MG PO TBEC [80436]: 40 mg | ORAL | @ 03:00:00 | NDC 00904647461

## 2022-08-22 MED ADMIN — ATORVASTATIN 20 MG PO TAB [77412]: 20 mg | ORAL | @ 14:00:00 | NDC 67877051290

## 2022-08-22 MED ADMIN — ENOXAPARIN 40 MG/0.4 ML SC SYRG [85052]: 40 mg | SUBCUTANEOUS | @ 03:00:00 | NDC 00781324602

## 2022-08-23 MED ADMIN — ATORVASTATIN 20 MG PO TAB [77412]: 20 mg | ORAL | @ 13:00:00 | NDC 67877051290

## 2022-08-23 MED ADMIN — ENOXAPARIN 40 MG/0.4 ML SC SYRG [85052]: 40 mg | SUBCUTANEOUS | @ 01:00:00 | NDC 00781324602

## 2022-08-23 MED ADMIN — PANTOPRAZOLE 40 MG PO TBEC [80436]: 40 mg | ORAL | @ 01:00:00 | NDC 00904647461

## 2022-08-24 MED ADMIN — ATORVASTATIN 20 MG PO TAB [77412]: 20 mg | ORAL | @ 14:00:00 | NDC 67877051290

## 2022-08-24 MED ADMIN — PANTOPRAZOLE 40 MG PO TBEC [80436]: 40 mg | ORAL | @ 02:00:00 | NDC 00904647461

## 2022-08-24 MED ADMIN — ENOXAPARIN 40 MG/0.4 ML SC SYRG [85052]: 40 mg | SUBCUTANEOUS | @ 02:00:00 | NDC 00781324602

## 2022-08-25 ENCOUNTER — Encounter: Admit: 2022-08-25 | Discharge: 2022-08-25 | Payer: BC Managed Care – HMO

## 2022-08-25 MED ORDER — BRIVARACETAM 25 MG PO TAB
ORAL_TABLET | 0 refills | Status: AC
Start: 2022-08-25 — End: ?

## 2022-08-25 MED ADMIN — BRIVARACETAM 25 MG PO TAB [329832]: 25 mg | ORAL | @ 14:00:00 | Stop: 2022-08-25 | NDC 50474047066

## 2022-08-25 MED ADMIN — BRIVARACETAM 25 MG PO TAB [329832]: 25 mg | ORAL | @ 02:00:00 | NDC 50474047066

## 2022-08-25 MED ADMIN — ACETAMINOPHEN 325 MG PO TAB [101]: 650 mg | ORAL | @ 12:00:00 | Stop: 2022-08-25 | NDC 00904677361

## 2022-08-25 MED ADMIN — ATORVASTATIN 20 MG PO TAB [77412]: 20 mg | ORAL | @ 14:00:00 | Stop: 2022-08-25 | NDC 67877051290

## 2022-08-25 MED ADMIN — PANTOPRAZOLE 40 MG PO TBEC [80436]: 40 mg | ORAL | @ 02:00:00 | NDC 00904647461

## 2022-08-25 MED ADMIN — OXCARBAZEPINE 300 MG PO TAB [78916]: 600 mg | ORAL | @ 17:00:00 | Stop: 2022-08-25 | NDC 00904726361

## 2022-08-25 MED FILL — OXCARBAZEPINE 600 MG PO TAB: 600 mg | ORAL | 10 days supply | Status: CN

## 2022-08-25 MED FILL — CLONAZEPAM 0.5 MG PO TAB: 0.5 mg | ORAL | 6 days supply | Qty: 9 | Fill #1 | Status: CP

## 2022-08-26 ENCOUNTER — Encounter: Admit: 2022-08-26 | Discharge: 2022-08-26 | Payer: BC Managed Care – HMO

## 2022-08-29 ENCOUNTER — Encounter: Admit: 2022-08-29 | Discharge: 2022-08-29 | Payer: BC Managed Care – HMO

## 2022-09-09 ENCOUNTER — Encounter: Admit: 2022-09-09 | Discharge: 2022-09-09 | Payer: BC Managed Care – HMO

## 2022-09-09 MED ORDER — BRIVIACT 50 MG PO TAB
ORAL_TABLET | 0 refills
Start: 2022-09-09 — End: ?

## 2022-09-29 ENCOUNTER — Encounter: Admit: 2022-09-29 | Discharge: 2022-09-29 | Payer: BC Managed Care – HMO

## 2022-09-30 ENCOUNTER — Encounter: Admit: 2022-09-30 | Discharge: 2022-09-30 | Payer: BC Managed Care – HMO

## 2022-09-30 DIAGNOSIS — F32A Depression: Secondary | ICD-10-CM

## 2022-09-30 DIAGNOSIS — R0609 Other forms of dyspnea: Secondary | ICD-10-CM

## 2022-09-30 DIAGNOSIS — R011 Cardiac murmur, unspecified: Secondary | ICD-10-CM

## 2022-09-30 DIAGNOSIS — I493 Ventricular premature depolarization: Secondary | ICD-10-CM

## 2022-09-30 DIAGNOSIS — I499 Cardiac arrhythmia, unspecified: Secondary | ICD-10-CM

## 2022-09-30 DIAGNOSIS — R569 Unspecified convulsions: Secondary | ICD-10-CM

## 2022-09-30 DIAGNOSIS — K219 Gastro-esophageal reflux disease without esophagitis: Secondary | ICD-10-CM

## 2022-09-30 DIAGNOSIS — M1990 Unspecified osteoarthritis, unspecified site: Secondary | ICD-10-CM

## 2022-09-30 DIAGNOSIS — I4729 NSVT (nonsustained ventricular tachycardia) (HCC): Secondary | ICD-10-CM

## 2022-09-30 DIAGNOSIS — R42 Dizziness and giddiness: Secondary | ICD-10-CM

## 2022-09-30 DIAGNOSIS — G4730 Sleep apnea, unspecified: Secondary | ICD-10-CM

## 2022-09-30 DIAGNOSIS — R519 Generalized headaches: Secondary | ICD-10-CM

## 2022-09-30 DIAGNOSIS — R079 Chest pain, unspecified: Secondary | ICD-10-CM

## 2022-09-30 DIAGNOSIS — I2510 Atherosclerotic heart disease of native coronary artery without angina pectoris: Secondary | ICD-10-CM

## 2022-09-30 DIAGNOSIS — G4733 Obstructive sleep apnea (adult) (pediatric): Secondary | ICD-10-CM

## 2022-09-30 NOTE — Progress Notes
Date of Service: 09/30/2022    Johnathan Reese is a 57 y.o. male.       HPI     Johnathan Reese was in the [facility] today for follow-up regarding LV systolic dysfunction and ventricular ectopy.  I have seen him a few times over the past year and he was initially referred for frequent, asymptomatic PVCs.      He has a rather persistent seizure disorder and is being managed by Dr. Ronda Fairly at [facility].  He had 8 days of seizure monitoring at [facility] in April which resulted in a change in medications.     From a cardiac standpoint the picture is a little bit confusing.  We did a cardiac MRI in September and it looked different than the echocardiogram he had done over at [facility] earlier in the summer.  The echocardiogram had looked normal but the MRI suggested an ejection fraction of about 40% with some thinning and hypokinesis involving the inferolateral base.  He had already had coronary CT angiography at [facility] and does not have any significant obstructive coronary disease.  We checked a lipid profile and lipoprotein a looking for evidence of a metabolic disorder that might make him more prone to thrombus formation, but these numbers were either normal or only minimally abnormal.  His LDL was 120 and he does have a little bit of coronary calcification so he got started on atorvastatin back in September.     He has some occasional mild palpitation symptoms now but nothing to suggest any syncope or near syncope.  He has a little bit of vestibular dysfunction and if he moves his head too rapidly he gets a little bit of nausea.     He has not had any chest discomfort.  He has a little bit of breathlessness at times that he attributes to being out of shape.     He does have sleep apnea and has not tolerated the CPAP device.          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 PACs not found. PVC burden 16%.       NSVT (nonsustained ventricular tachycardia) (HCC) 11/19/2021    Dyspnea on exertion 11/19/2021     - 10/25/21- [facility]- Calcium Score- Total 4-LAD. LA and LV normal is size. No stigmata of prior infarction. Normal pulmonary venous drainage. No thickening or calcifications in the aortic and mitral valves.       Abnormal EKG 11/19/2021     - 08/16/21- [facility]- Echo- Normal LV systolic function. No color flow or doppler evidence of hemodynamically significant valvular dysfunction.       Coronary artery disease involving native coronary artery  of native heart without angina pectoris 11/19/2021         Review of Systems   Constitutional: Negative.   HENT: Negative.     Eyes: Negative.    Cardiovascular: Negative.    Respiratory: Negative.     Endocrine: Negative.    Hematologic/Lymphatic: Negative.    Skin: Negative.    Musculoskeletal: Negative.    Gastrointestinal: Negative.    Genitourinary: Negative.    Neurological:  Positive for dizziness.   Psychiatric/Behavioral: Negative.     Allergic/Immunologic: Negative.        Physical Exam    Physical Exam   General Appearance: no distress   Skin: warm, no ulcers or xanthomas   Digits and Nails: no cyanosis or clubbing   Eyes: conjunctivae and lids normal, pupils are equal and round   Teeth/Gums/Palate: dentition unremarkable, no lesions   Lips & Oral Mucosa: no pallor or cyanosis   Neck Veins: normal JVP , neck veins are not distended   Thyroid: no nodules, masses, tenderness or enlargement   Chest Inspection: chest is normal in appearance   Respiratory Effort: breathing comfortably, no respiratory distress   Auscultation/Percussion: lungs clear to auscultation, no rales or rhonchi, no wheezing   PMI: PMI not enlarged or displaced   Cardiac Rhythm: regular rhythm and normal rate   Cardiac Auscultation: S1, S2 normal, no rub, no gallop   Murmurs: no murmur   Peripheral Circulation: normal peripheral circulation   Carotid Arteries: normal carotid upstroke bilaterally, no bruits   Radial Arteries: normal symmetric radial pulses   Abdominal Aorta: no abdominal aortic bruit   Pedal Pulses: normal symmetric pedal pulses   Lower Extremity Edema: no lower extremity edema   Abdominal Exam: soft, non-tender, no masses, bowel sounds normal   Liver & Spleen: no organomegaly   Gait & Station: walks without assistance   Muscle Strength: normal muscle tone   Orientation: oriented to time, place and person   Affect & Mood: appropriate and sustained affect   Language and Memory: patient responsive and seems to comprehend information   Neurologic Exam: neurological assessment grossly intact   Other: moves all extremities      Cardiovascular Health Factors  Vitals BP Readings from Last 3 Encounters:   09/30/22 118/83   08/25/22 117/72   06/29/22 124/80     Wt Readings from Last 3 Encounters:   09/30/22 77.6 kg (171 lb)   08/17/22 74.8 kg (165 lb)   06/22/22 74.7 kg (164 lb 10.9 oz)     BMI Readings from Last 3 Encounters:   09/30/22 22.56 kg/m?   08/17/22 21.77 kg/m?   06/22/22 21.73 kg/m?      Smoking Social History     Tobacco Use   Smoking Status Never    Passive exposure: Never   Smokeless Tobacco Never      Lipid Profile Cholesterol   Date Value Ref Range Status   02/05/2022 187  Final     HDL   Date Value Ref Range Status   02/05/2022 45  Final     LDL   Date Value Ref Range Status   02/05/2022 120 (H) <100 Final     Triglycerides   Date Value Ref Range Status   02/05/2022 110  Final      Blood Sugar No results found for: HGBA1C  Glucose   Date Value Ref Range Status   08/21/2022 86 70 - 100 MG/DL Final   16/02/9603 90 70 - 100 MG/DL Final  08/19/2022 97 70 - 100 MG/DL Final          Problems Addressed Today  Encounter Diagnoses   Name Primary?    Coronary artery disease involving native coronary artery of native heart without angina pectoris Yes    NSVT (nonsustained ventricular tachycardia) (HCC)     OSA (obstructive sleep apnea)     Dyspnea on exertion     PVC's (premature ventricular contractions)        Assessment and Plan       PVC's (premature ventricular contractions)  His palpitation symptoms have pretty much resolved at this point.  He denies any problems with syncope or near syncope.    I still suspect that the combination of cardiac MR abnormalities and PVCs are all related to an episode of myocarditis that seems to have resolved.  I am planning to see Johnathan Reese back in about 6 months and will repeat an echocardiogram at that point.    Coronary artery disease involving native coronary artery of native heart without angina pectoris  He has minimal coronary disease and is tolerating the statin.      Current Medications (including today's revisions)   acetaminophen SR (TYLENOL 8 HOUR) 650 mg tablet Take one tablet by mouth every 8 hours as needed for Pain.    atorvastatin (LIPITOR) 20 mg tablet Take one tablet by mouth daily.    brivaracetam (BRIVIACT) 50 mg tablet Take one tablet by mouth twice daily.    celecoxib (CELEBREX) 50 mg capsule Take one capsule by mouth daily.    omeprazole DR (PRILOSEC) 20 mg capsule Take one capsule by mouth daily before breakfast.    ondansetron (ZOFRAN ODT) 4 mg rapid dissolve tablet Dissolve one tablet by mouth every 8 hours as needed for Nausea or Vomiting. Place on tongue to dissolve.    OXcarbazepine (TRILEPTAL) 600 mg tablet Take one tablet by mouth twice daily. Indications: unclassified epilepsy    zolpidem (AMBIEN) 10 mg tablet Take one tablet by mouth at bedtime as needed for Sleep.     Total time spent on today's office visit was 30 minutes.  This includes face-to-face in person visit with patient as well as nonface-to-face time including review of the EMR, outside records, labs, radiologic studies, echocardiogram & other cardiovascular studies, formation of treatment plan, after visit summary, future disposition, and lastly on documentation.

## 2022-09-30 NOTE — Assessment & Plan Note
He has minimal coronary disease and is tolerating the statin.

## 2022-11-10 ENCOUNTER — Encounter: Admit: 2022-11-10 | Discharge: 2022-11-10 | Payer: BC Managed Care – HMO

## 2023-01-05 ENCOUNTER — Encounter: Admit: 2023-01-05 | Discharge: 2023-01-05 | Payer: BC Managed Care – HMO

## 2023-01-05 MED ORDER — ATORVASTATIN 20 MG PO TAB
20 mg | ORAL_TABLET | Freq: Every day | ORAL | 3 refills
Start: 2023-01-05 — End: ?

## 2023-02-06 ENCOUNTER — Encounter: Admit: 2023-02-06 | Discharge: 2023-02-06 | Payer: BC Managed Care – HMO

## 2023-02-10 ENCOUNTER — Encounter: Admit: 2023-02-10 | Discharge: 2023-02-10 | Payer: BC Managed Care – HMO

## 2023-02-10 ENCOUNTER — Ambulatory Visit: Admit: 2023-02-10 | Discharge: 2023-02-11 | Payer: BC Managed Care – HMO

## 2023-02-10 DIAGNOSIS — G4730 Sleep apnea, unspecified: Secondary | ICD-10-CM

## 2023-02-10 DIAGNOSIS — G40919 Epilepsy, unspecified, intractable, without status epilepticus: Secondary | ICD-10-CM

## 2023-02-10 DIAGNOSIS — K219 Gastro-esophageal reflux disease without esophagitis: Secondary | ICD-10-CM

## 2023-02-10 DIAGNOSIS — F32A Depression: Secondary | ICD-10-CM

## 2023-02-10 DIAGNOSIS — M1990 Unspecified osteoarthritis, unspecified site: Secondary | ICD-10-CM

## 2023-02-10 DIAGNOSIS — R569 Unspecified convulsions: Secondary | ICD-10-CM

## 2023-02-10 DIAGNOSIS — I499 Cardiac arrhythmia, unspecified: Secondary | ICD-10-CM

## 2023-02-10 DIAGNOSIS — R011 Cardiac murmur, unspecified: Secondary | ICD-10-CM

## 2023-02-10 DIAGNOSIS — R079 Chest pain, unspecified: Secondary | ICD-10-CM

## 2023-02-10 DIAGNOSIS — R519 Generalized headaches: Secondary | ICD-10-CM

## 2023-02-10 DIAGNOSIS — R42 Dizziness and giddiness: Secondary | ICD-10-CM

## 2023-02-10 NOTE — Progress Notes
TELEHEALTH VISIT       History of Present Illness  I last saw Johnathan Reese on 07/29/2022 for undefined epilepsy.  In the interim, he had repeat 8 day EMU which was normal with one episode of restlessness and a feeling behind his eyes that had no epileptiform correlate.  He was to have seen his cardiologist or primary doctor for dizziness as well.  Lastly, he was switched to brivaracetam.    He's done well since that switch and is seizure free.  His vertigo has been eliminated.  He inquires about driving.    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, insomnia (report reviewed)  [facility] (April 2024) - normal with one episode of restlessness / feeling behind his eye without EEG correlate (report reviewed)       Review of systems  The following systems were reviewed and were negative except as mentioned elsewhere:      Constitutional:  No fever/chills/sweat, weight loss, tiredness/fatigue, or poor appetite  Eyes:  No reduced vision or blurriness, double vision, or droopy eyelids  ENT:  No hearing loss, ringing in the ears, vertigo, or hoarseness  Cardiovascular:  No chest pain/angina or palpitations  Respiratory:  No shortness of breath or cough  Gastrointestinal:  No abdominal pain, nausea and/or vomiting, diarrhea, or constipation  Genitourinary:  No pain with urination, excessive urination, or incontinence  Musculoskeletal:  No back pain, neck pain, joint pain/redness/swelling, or myalgia  Skin:  No jaundice, rash, or change in sweating  Hematologic/ Lymphatic:  No anemia, easy bruising, bleeding, or enlarged lymph nodes  Endocrine:  No temperature intolerance, polyuria, or polydipsia  Allergic/ Immunological:  No severe allergic reaction  Psychiatric:  No depression or anxiety    Current Outpatient Medications on File Prior to Visit   Medication Sig Dispense Refill    acetaminophen SR (TYLENOL 8 HOUR) 650 mg tablet Take one tablet by mouth every 8 hours as needed for Pain.      atorvastatin (LIPITOR) 20 mg tablet TAKE ONE TABLET BY MOUTH EVERY DAY 90 tablet 3    brivaracetam (BRIVIACT) 50 mg tablet Take one tablet by mouth twice daily. 60 tablet 5    celecoxib (CELEBREX) 50 mg capsule Take one capsule by mouth daily.      omeprazole DR (PRILOSEC) 20 mg capsule Take one capsule by mouth daily before breakfast.      ondansetron (ZOFRAN ODT) 4 mg rapid dissolve tablet Dissolve one tablet by mouth every 8 hours as needed for Nausea or Vomiting. Place on tongue to dissolve. 30 tablet 0    OXcarbazepine (TRILEPTAL) 600 mg tablet Take one tablet by mouth twice daily. Indications: unclassified epilepsy 90 tablet 0    zolpidem (AMBIEN) 10 mg tablet Take one tablet by mouth at bedtime as needed for Sleep.       No current facility-administered medications on file prior to visit.       Exam  General appearance: He is not in acute distress.    HEENT: The head is normocephalic without evidence of previous trauma.  Oropharynx is without obvious lesion.  Eyes: Normal sclera  Neck: Full range of motion  Pulmonary: Notable for unlabored breathing.    Skin: Exhibits normal temperature and showed no rash.    Extremities: No edema is noted.  Psychiatric: Normal thought process and appropriate affect    NEUROLOGICAL EXAM  Mental Status: The patient is attentive, alert, and oriented to person, time and place.  Language comprehension, naming, concentration, and recent/remote memory are intact.  Speech is fluent.    Cranial Nerves: Pupils are equal.  Visual fields are grossly intact.  Extraocular movements are intact without nystagmus.  Facial strength is normal.  Hearing is intact to conversation.  Tongue and uvular are in the midline.  Shoulder shrug is normal bilaterally.      Motor: Muscle bulk is normal.  Motor power is at least 3/5 in the bilateral deltoids, biceps, triceps, wrist extensors, grip, iliopsoas, quads, hamstrings, gastrocnemius, and tibialis anterior.    Sensory: Unable to assess    Cerebellar:  There is no abnormal movement.      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  We'll continue his brivaracetam.    EPILEPSY CLASSIFICATION  Epileptogenic zone:   Unknown  Seizure semiology: Bilateral tonic clonic seizure  Etiology:  Unknown  Comorbidities:    Two brothers with febrile seizures    MANAGEMENT AND PLAN  During the visit I discussed my impression, recommended diagnostic studies, prognosis, risks and benefits of management, instructions for management, and importance of compliance.  After a discussion, the patient agrees with the plan.  Total time for this telehealth with video visit was 20 minutes.    RECOMMENDATIONS  1. Continue brivaracetam 50mg  2x daily    The patient is instructed not to drive unless 6 months free of alteration of consciousness, not to work in close proximity of machines with moving parts, not to swim unsupervised or to work at high places. The patient is to shower (without accumulation of water) instead of taking a bath if unsupervised.    FOLLOWUP PLAN  I plan to see the patient back for follow-up in approximately 8 months.  The patient is instructed to contact us if there is an exacerbation of the seizures or any side effects from the antiseizure medications.       This is a telemedicine visit that was performed with the originating site at [facility] and the distant site at his home.  Verbal consent to participate in telehealth visit was obtained. I discussed with the patient the nature of our telemedicine visits, that:     I would evaluate the patient and recommend diagnostics and treatments based on my assessment   Our sessions are not being recorded and that personal health information is protected   Our team would provide follow up care in person if/when the patient needs it

## 2023-02-28 ENCOUNTER — Encounter: Admit: 2023-02-28 | Discharge: 2023-02-28 | Payer: BC Managed Care – HMO

## 2023-02-28 MED ORDER — BRIVIACT 50 MG PO TAB
50 mg | ORAL_TABLET | Freq: Two times a day (BID) | ORAL | 5 refills
Start: 2023-02-28 — End: ?

## 2023-04-02 ENCOUNTER — Ambulatory Visit: Admit: 2023-04-02 | Discharge: 2023-04-03 | Payer: BC Managed Care – HMO

## 2023-04-02 ENCOUNTER — Encounter: Admit: 2023-04-02 | Discharge: 2023-04-02 | Payer: BC Managed Care – HMO

## 2023-04-03 ENCOUNTER — Encounter: Admit: 2023-04-03 | Discharge: 2023-04-03 | Payer: BC Managed Care – HMO

## 2023-04-03 NOTE — Telephone Encounter
-----   Message from Jonelle Sports, MD sent at 04/03/2023  4:33 PM CST -----  [facility], I'm seeing Johnathan Reese in a couple of weeks, but you could give him a heads-up that this echo looks very similar to the cardiac MR we did last year, no significant change.    Cc:  Dr. Herschell Dimes

## 2023-04-03 NOTE — Telephone Encounter
Results and recommendations called to patient lmom requested call back if questions

## 2023-04-15 ENCOUNTER — Encounter: Admit: 2023-04-15 | Discharge: 2023-04-15 | Payer: BC Managed Care – HMO

## 2023-04-21 ENCOUNTER — Ambulatory Visit: Admit: 2023-04-21 | Discharge: 2023-04-21 | Payer: BC Managed Care – HMO

## 2023-04-21 ENCOUNTER — Encounter: Admit: 2023-04-21 | Discharge: 2023-04-21 | Payer: BC Managed Care – HMO

## 2023-04-22 ENCOUNTER — Encounter: Admit: 2023-04-22 | Discharge: 2023-04-22 | Payer: BC Managed Care – HMO

## 2023-04-22 DIAGNOSIS — M778 Other enthesopathies, not elsewhere classified: Secondary | ICD-10-CM

## 2023-04-22 DIAGNOSIS — M25521 Pain in right elbow: Secondary | ICD-10-CM

## 2023-05-07 ENCOUNTER — Encounter: Admit: 2023-05-07 | Discharge: 2023-05-07 | Payer: BC Managed Care – HMO

## 2023-05-07 DIAGNOSIS — I493 Ventricular premature depolarization: Secondary | ICD-10-CM

## 2023-05-07 DIAGNOSIS — I519 Heart disease, unspecified: Secondary | ICD-10-CM

## 2023-05-07 DIAGNOSIS — I2510 Atherosclerotic heart disease of native coronary artery without angina pectoris: Secondary | ICD-10-CM

## 2023-05-07 NOTE — Progress Notes
[facility] Navigation Intake Assessment Document    Patient Name:  Johnathan Reese  MRN:  [id number]  DOB:  04/06/1966, 57 y.o.  Primary contact for patient: [phone]   Insurance:   Payor: [employer], [employer] / Plan: [employer] [place] [employer] / Product Type: HMO /    Insurance reviewed: Registration completed by [employer]    Referring Physician: Dr. 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    Appointment Plan:     Future Appointments   Date Time Provider Department Center   05/14/2023 10:00 AM Lyndel Safe, MD [facility] CVM Exam       Care Coordination: New Patient Labs    05/07/23 - Confirmed appointment details with patient. Coordination of care testing reviewed: labs - patient is agreeable to complete at [facility] prior to OV. No further questions or concerns at this time. Phone number for nurse navigator provided if any questions arise prior to appointment: [phone].    Appointment confirmation and resources sent to patient via Mychart.     Cardiomyopathy Summary:     Hospitalizations/ER Visits in past 12 months:   06/22/22 - 06/29/22 at [place] [facility] cc: epilepsy  08/17/22 - 08/25/22 at [facility] cc: epilepsy      Summary of Recent Testing/Procedures Pertinent to Diagnosis:   Test/  Procedure Date/  Facility Results Summary   (please reference original report for full results)   Most Recent TTE 04/02/23 at [facility]   The left ventricle is severely dilated. The left ventricular wall thickness is normal. Mild eccentric hypertrophy.    The left ventricular systolic function is moderately reduced. The visually estimated ejection fraction is 40%.    There are segmental wall motion abnormalities, as described below.    Grade I (mild) left ventricular diastolic dysfunction.    The right ventricle is mildly dilated. The right ventricular systolic function is normal. The pulmonary artery pressure could not be estimated due to inadequate tricuspid regurgitation signal.    Moderately dilated left atrium.    No significant valve dysfunction.    No pericardial effusion.     Cardiac MRI 01/14/22 at [facility].  Technically difficult study with significant cardiac motion artifact   due to frequent PVCs.   2.  Moderately dilated left ventricle. Moderately reduced left ventricular   systolic function; EF 39%. Hypokinesis and thinning of the mid to basal   inferolateral wall with associated subendocardial delayed gadolinium   enhancement in the mid to basal inferolateral wall segments consistent   with prior area of infarction. There is also small focal subendocardial   delayed gadolinium enhancement involving the apical/apical septal   segments.   3. 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   4.  Normal right ventricular size and systolic function.   5.  No clinically significant valvular abnormalities.   6.  Normal biatrial size.      CT Cardiac 10/25/21 at [facility]  CORONARY ANGIOGRAPHY: Coronary artery circulation is left dominant.     Left Main: No plaque   Left anterior descending artery: Minimal calcified plaque.   Left circumflex artery: No plaque.   Right coronary artery: No plaque.     NON-CORONARY FINDINGS:     Left Atrium: Left atrial size is normal in size with no left atrial appendage filling  defect.   Left Ventricle: The ventricular cavity size is within normal limits. There are no stigmata of prior infarction. There is no abnormal filling defect.   Pulmonary veins: Normal pulmonary venous drainage. There were four pulmonary veins, two on the right and two on the left.   Pericardium: Normal thickness with no significant effusion or calcium present.   Cardiac valves: There is no thickening or calcifications in the aortic and mitral valves.   Aorta: Normal caliber with no significant disease.   Lungs: Clear.    Moderate gastric mucosal thickening.     Report Location: Care Everywhere     Cardiac Event Monitor 09/17/21 at [facility] Patient monitored for 19d 19h 67m   36 events were transmitted. 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 PACs not found. PVC burden 16%.       NSVT (nonsustained ventricular tachycardia) (HCC) 11/19/2021    Abnormal EKG 11/19/2021     - 08/16/21- [facility]- Echo- Normal LV systolic function. No color flow or doppler evidence of hemodynamically significant valvular dysfunction.       Coronary artery disease involving native coronary artery of native heart without angina pectoris 11/19/2021     10/25/21- [facility]- Calcium Score- Total 4-LAD. LA and LV normal is size. No stigmata of prior infarction. Normal pulmonary venous drainage. No thickening or calcifications in the aortic and mitral valves.          Past Medical History:    Arthritis    Cardiac dysrhythmia    Chest pain    Depression    Dizziness    Generalized headaches    GERD (gastroesophageal reflux disease)    Seizure (HCC)    Sleep apnea    Undiagnosed cardiac murmurs       Surgical History:   Procedure Laterality Date    INGUINAL HERNIA REPAIR      TILT TABLE STUDY  Not yet    It was recommended, scheduled and then denied at [facility] but approved at diagnostic imaging center but i havent heard from them to scedule it.       Family History   Problem Relation Name Age of Onset    Stroke Father Kermit Rachal     Dementia Father Octavius Prak     Seizures Brother Kartikeya Eberle         Just one seizure    Seizures Brother Teagon Gentil         As a child    Seizures Brother  febrile seizure    Sudden Cardiac Death Brother Dain Josh         Died at 86 of a heart episode. I was told his heart basically exploded but I'm not sure what exactly that means.    Seizures Maternal Uncle Allen Norris         [facility], nobody was really sure but that he had seizures as a kid but nothing long term. Only found out recently after asking when my seizures started.    Dementia Paternal Aunt          Lewy Body    Parkinson's  Paternal Uncle      Cancer Paternal Grandfather John Kindig         liver    Migraines Neg Hx         Social History     Socioeconomic History    Marital status: Single    Years of education: 14   Occupational History    Occupation: Holiday representative   Tobacco Use    Smoking status: Never     Passive exposure: Never    Smokeless tobacco: Never   Vaping Use    Vaping status: Never Used   Substance and Sexual Activity    Alcohol use: Yes     Comment: Only on special occasions.    Drug use: Yes     Frequency: 7.0 times per week     Types: Marijuana     Comment: Daily smoker    Sexual activity: Yes     Partners: Female     Birth control/protection: Condom   Social History Narrative    Lives alone       Allergies   Allergen Reactions    Levetiracetam SEE COMMENTS     Mood swings, Shakiness, Suicidal ideations           Current Outpatient Medications:     acetaminophen SR (TYLENOL 8 HOUR) 650 mg tablet, Take one tablet by mouth every 8 hours as needed for Pain., Disp: , Rfl:     atorvastatin (LIPITOR) 20 mg tablet, TAKE ONE TABLET BY MOUTH EVERY DAY, Disp: 90 tablet, Rfl: 3    BRIVIACT 50 mg tablet, TAKE ONE TABLET BY MOUTH TWICE DAILY, Disp: 60 tablet, Rfl: 5    celecoxib (CELEBREX) 50 mg capsule, Take one capsule by mouth daily., Disp: , Rfl:     omeprazole DR (PRILOSEC) 20 mg capsule, Take one capsule by mouth daily before breakfast., Disp: , Rfl:     ondansetron (ZOFRAN ODT) 4 mg rapid dissolve tablet, Dissolve one tablet by mouth every 8 hours as needed for Nausea or Vomiting. Place on tongue to dissolve., Disp: 30 tablet, Rfl: 0    zolpidem (AMBIEN) 10 mg tablet, Take one tablet by mouth at bedtime as needed for Sleep., Disp: , Rfl:     Tora Perches, RN, BSN, [facility]  Nurse Navigator - Cardiomyopathy Clinic  Phone: [phone]  Fax: [phone]  [facility]  Outpatient Cardiology Dept  Doctors: Micheline Chapman, [place], [place]. Dickie La. Sherryll Burger

## 2023-05-11 ENCOUNTER — Encounter: Admit: 2023-05-11 | Discharge: 2023-05-11 | Payer: BC Managed Care – HMO

## 2023-05-14 ENCOUNTER — Encounter: Admit: 2023-05-14 | Discharge: 2023-05-14 | Payer: BC Managed Care – HMO

## 2023-05-14 ENCOUNTER — Ambulatory Visit: Admit: 2023-05-14 | Discharge: 2023-05-14 | Payer: BC Managed Care – HMO

## 2023-05-14 ENCOUNTER — Ambulatory Visit: Admit: 2023-05-14 | Discharge: 2023-05-15 | Payer: BC Managed Care – HMO

## 2023-05-14 DIAGNOSIS — G4733 Obstructive sleep apnea (adult) (pediatric): Secondary | ICD-10-CM

## 2023-05-14 DIAGNOSIS — F1210 Cannabis abuse, uncomplicated: Secondary | ICD-10-CM

## 2023-05-14 DIAGNOSIS — I420 Dilated cardiomyopathy: Secondary | ICD-10-CM

## 2023-05-14 DIAGNOSIS — R9431 Abnormal electrocardiogram [ECG] [EKG]: Secondary | ICD-10-CM

## 2023-05-14 DIAGNOSIS — R0609 Other forms of dyspnea: Secondary | ICD-10-CM

## 2023-05-14 DIAGNOSIS — I2510 Atherosclerotic heart disease of native coronary artery without angina pectoris: Secondary | ICD-10-CM

## 2023-05-14 DIAGNOSIS — Z136 Encounter for screening for cardiovascular disorders: Secondary | ICD-10-CM

## 2023-05-14 DIAGNOSIS — I4729 NSVT (nonsustained ventricular tachycardia) (HCC): Secondary | ICD-10-CM

## 2023-05-14 DIAGNOSIS — I493 Ventricular premature depolarization: Secondary | ICD-10-CM

## 2023-05-14 DIAGNOSIS — I5022 Chronic systolic (congestive) heart failure: Secondary | ICD-10-CM

## 2023-05-14 MED ORDER — SPIRONOLACTONE 25 MG PO TAB
12.5 mg | ORAL_TABLET | Freq: Every day | ORAL | 1 refills | 90.00000 days | Status: AC
Start: 2023-05-14 — End: ?

## 2023-05-14 MED ORDER — METOPROLOL SUCCINATE 25 MG PO TB24
12.5 mg | ORAL_TABLET | Freq: Every day | ORAL | 1 refills | 90.00000 days | Status: AC
Start: 2023-05-14 — End: ?

## 2023-05-14 NOTE — Progress Notes
Date of Service: 05/14/2023    Patient: Johnathan Reese; ZOX:[id number]; DOB: 07-05-1965  His PCP is Lona Kettle.    Referring physician:Owens, Saul Fordyce, MD         Subjective:  Johnathan Reese is a 58 y.o. male who presents to the advanced heart failure clinic for Initial visit.     He was diagnosed with heart failure with mildly reduced ejection fraction in September 2023: LVEF of 39% at that time.  LV end-diastolic volume index was 129 mL/m? cardiac MRI was done due to presence of significant PVCs and NSVT.    He has past medical history of heart failure with mildly reduced ejection fraction, frequent PVCs, NSVT, dilated cardiomyopathy, seizures, GERD.    Patient mentioned that he had recurrent episodes of seizures since May 2022.  Since change of his medications no more seizures since March 2024.  He does mention that when he has seizure activity he loses consciousness.    Today, he presents to the clinic to establish care with [place].  Patient mentioned that he has history of occasional left-sided chest pain which last for few minutes and then goes away.  The pain is like sharp and denies any radiation.  It gets better by itself.  This happens very rarely and has not happened lately.  He does have history of significant PVCs and NSVT but denies any palpitations.  His main complaint is fatigue and tiredness.  He can sleep easily during the day if not actively involved in activity.  He does mention that he gets short of breath too.  But interestingly shortness of breath gets better with exertion and exercise.  He denies any leg swelling or fluid overload.  Denies any episodes of syncope.  He does mention that he has history of sleep apnea and is not able to wear CPAP.     Baseline functional status: Walks independently.        Past Medical History:    Past Medical History:    Arthritis    Cardiac dysrhythmia    Chest pain    Depression    Dizziness    Generalized headaches    GERD (gastroesophageal reflux disease)    Seizure (HCC)    Sleep apnea    Undiagnosed cardiac murmurs          Surgical History:   Procedure Laterality Date    INGUINAL HERNIA REPAIR      TILT TABLE STUDY  Not yet    It was recommended, scheduled and then denied at [facility] but approved at diagnostic imaging center but i havent heard from them to scedule it.        Family History:  Patient mentioned that his brother passed away suddenly at the age of 32 years.  He also has history of dilated cardiomyopathy.  They have a total of 5 siblings but 1 brother died at age 8 years    Social History:  He used to work in [employer].  He also worked in Chemical engineer.  Currently is remodeling his house.  He is unmarried.  He has no kids.  He lives alone.  He smokes marijuana daily.  He denies smoking cigarettes.  He drinks maybe 1-2 drinks in a week.  No other illicit drugs.    Review of Systems:  General: Positive for fatigue and tiredness.  HEENT: negative for dry/itchy eyes, congestion, sore throat  Pulm: negative for cough, hemoptysis, and shortness of breath at rest  CV: as per  HPI  GI: negative for N/V, abdominal pain, blood in stools, constipation, diarrhea  GU: negative for dysuria, hematuria  MSK: negative for joint pain, joint swelling, muscle pain/weakness  Neuro: negative for headaches, numbness/tingling, seizures, tremors  Heme/Lymph: negative for bleeding problems, bruising  Derm: No rashes, dry skin, or skin lesions    Medications:    Current Outpatient Medications:     acetaminophen SR (TYLENOL 8 HOUR) 650 mg tablet, Take one tablet by mouth every 8 hours as needed for Pain., Disp: , Rfl:     atorvastatin (LIPITOR) 20 mg tablet, TAKE ONE TABLET BY MOUTH EVERY DAY, Disp: 90 tablet, Rfl: 3    BRIVIACT 50 mg tablet, TAKE ONE TABLET BY MOUTH TWICE DAILY, Disp: 60 tablet, Rfl: 5    celecoxib (CELEBREX) 50 mg capsule, Take one capsule by mouth daily., Disp: , Rfl:     metoprolol succinate XL (TOPROL XL) 25 mg extended release tablet, Take one-half tablet by mouth daily., Disp: 90 tablet, Rfl: 1    omeprazole DR (PRILOSEC) 20 mg capsule, Take one capsule by mouth daily before breakfast., Disp: , Rfl:     ondansetron (ZOFRAN ODT) 4 mg rapid dissolve tablet, Dissolve one tablet by mouth every 8 hours as needed for Nausea or Vomiting. Place on tongue to dissolve., Disp: 30 tablet, Rfl: 0    spironolactone (ALDACTONE) 25 mg tablet, Take one-half tablet by mouth daily. Take with food., Disp: 90 tablet, Rfl: 1    zolpidem (AMBIEN) 10 mg tablet, Take one tablet by mouth at bedtime as needed for Sleep., Disp: , Rfl:     Allergies:   Allergies   Allergen Reactions    Levetiracetam SEE COMMENTS     Mood swings, Shakiness, Suicidal ideations         Objective:  BP 129/83 (BP Source: Arm, Left Upper, Patient Position: Sitting)  - Pulse 58  - Ht 185.4 cm (6' 1)  - Wt 72.6 kg (160 lb) Comment: pt stated weight, he's wearing heavier boots and clothes today in office, 167.6 lbs on office scale - SpO2 98%  - BMI 21.11 kg/m?   BP Readings from Last 3 Encounters:   05/14/23 129/83   04/21/23 129/79   04/02/23 128/82      Pulse Readings from Last 3 Encounters:   05/14/23 58   04/21/23 76   09/30/22 73     Wt Readings from Last 8 Encounters:   05/14/23 72.6 kg (160 lb)   04/21/23 76.2 kg (168 lb)   04/02/23 72.1 kg (159 lb)   09/30/22 77.6 kg (171 lb)   08/17/22 74.8 kg (165 lb)   06/22/22 74.7 kg (164 lb 10.9 oz)   03/11/22 76.7 kg (169 lb)   02/06/22 77.3 kg (170 lb 6.4 oz)     Constitutional: He appears well-developed and well-nourished.   HENT:  Head: Normocephalic.   Mouth/Throat: Oropharynx is clear and moist.   Eyes: Conjunctivae are normal.   Neck: Normal range of motion. No JVD present.  Cardiovascular: Normal rate, regular rhythm, normal heart sounds and intact distal pulses.  No murmur heard.  Pulmonary/Chest: Effort normal and breath sounds normal. No respiratory distress. He has no wheezes. He has no rales. He exhibits no chest wall tenderness.   Abdominal: Soft. Bowel sounds are normal. He exhibits no distension. There is no tenderness.   Musculoskeletal: Normal range of motion and muscular tone. He exhibits no edema or tenderness.   Neurological: He is alert and oriented to person,  place, and time. No focal deficits.  Skin: Skin is warm. No erythema.   Psychiatric: He has a normal mood and affect. Judgment, behavior, and thought content normal.       Labs Reviewed:  CBC w/Diff    Lab Results   Component Value Date/Time    WBC 7.07 05/08/2023 12:00 AM    RBC 4.41 (L) 05/08/2023 12:00 AM    HGB 14.7 05/08/2023 12:00 AM    HCT 43.3 05/08/2023 12:00 AM    MCV 98.2 (H) 05/08/2023 12:00 AM    MCH 33.3 (H) 05/08/2023 12:00 AM    MCHC 33.9 05/08/2023 12:00 AM    RDW 13.4 08/21/2022 06:30 AM    PLTCT 237 05/08/2023 12:00 AM    MPV 10.2 05/08/2023 12:00 AM    Lab Results   Component Value Date/Time    NEUT 59.9 05/08/2023 12:00 AM    ANC 4.24 05/08/2023 12:00 AM    LYMA 31.7 05/08/2023 12:00 AM    ALC 2.24 05/08/2023 12:00 AM    MONA 6.4 05/08/2023 12:00 AM    AMC 0.45 05/08/2023 12:00 AM    EOSA 1 06/22/2022 04:07 PM    AEC 0.09 05/08/2023 12:00 AM    BASA 0.4 05/08/2023 12:00 AM    ABC 0.03 05/08/2023 12:00 AM        Comprehensive Metabolic Profile    Lab Results   Component Value Date/Time    NA 140 05/08/2023 12:00 AM    K 3.9 05/08/2023 12:00 AM    K 4.1 08/21/2022 06:30 AM    K 4.1 08/20/2022 08:53 AM    K 4.0 08/19/2022 10:38 PM    CL 108 (H) 05/08/2023 12:00 AM    CO2 21.0 (L) 05/08/2023 12:00 AM    GAP 11 05/08/2023 12:00 AM    BUN 23.9 05/08/2023 12:00 AM    CR 1.07 05/08/2023 12:00 AM    CR 0.94 08/21/2022 06:30 AM    CR 0.88 08/20/2022 08:53 AM    CR 0.96 08/19/2022 10:38 PM    GLU 116 (H) 05/08/2023 12:00 AM    Lab Results   Component Value Date/Time    CA 9.2 05/08/2023 12:00 AM    ALBUMIN 3.9 05/08/2023 12:00 AM    TOTPROT 7.5 05/08/2023 12:00 AM    ALKPHOS 71 05/08/2023 12:00 AM    AST 14 05/08/2023 12:00 AM    ALT 13 05/08/2023 12:00 AM    TOTBILI 0.72 05/08/2023 12:00 AM    GFR 81.4 05/08/2023 12:00 AM        Cardiac Enzymes Lipids   No results found for: BNP, TNI Lab Results   Component Value Date    CHOL 187 02/05/2022    TRIG 110 02/05/2022    HDL 45 02/05/2022    LDL 120 (H) 02/05/2022    VLDL 22 02/05/2022    CHOLHDLC 4 02/05/2022         Coagulation Endocrine   No results found for: INR, PT Lab Results   Component Value Date/Time    HGBA1C 5.4 05/08/2023 12:00 AM    TSH 0.31 (L) 05/08/2023 12:00 AM             Imaging and procedures reviewed:  Echo: 04/02/2023:    The left ventricle is severely dilated. The left ventricular wall thickness is normal. Mild eccentric hypertrophy.    The left ventricular systolic function is moderately reduced. The visually estimated ejection fraction is 40%.  LV end-diastolic dimension of 6.4 cm and  LV diastolic volume index of 133 mL/m?    There are segmental wall motion abnormalities, as described below.    Grade I (mild) left ventricular diastolic dysfunction.    The right ventricle is mildly dilated. The right ventricular systolic function is normal. The pulmonary artery pressure could not be estimated due to inadequate tricuspid regurgitation signal.    Moderately dilated left atrium.    No significant valve dysfunction.    No pericardial effusion.    Echo in 08/16/21 at [facility]:  1.  Normal left ventricular systolic function.   2.  No color-flow or Doppler evidence of hemodynamically significant   valvular dysfunction.     CT cardiac 10/25/21:   1. Coronary Calcium (Agatston): 4, 18th percentile. No obstructive plaque.     Event monitor: 09/17/21:    36 events were available for review.  All of the episodes demonstrate   sinus rhythm.  There are frequent PVCs with a burden of 16%.  The PVCs are   polymorphic.  There was a single 9 beat run of nonsustained ventricular   tachycardia.     Cardiac MRI: 01/14/22:  1.  Technically difficult study with significant cardiac motion artifact   due to frequent PVCs.  LV end-diastolic volume index was 129 mL/m?. Calculated CO: 5.58 L/Min. Calculated CI: 2.72 L/Min/M2.   2.  Moderately dilated left ventricle. Moderately reduced left ventricular   systolic function; EF 39%. Hypokinesis and thinning of the mid to basal   inferolateral wall with associated subendocardial delayed gadolinium   enhancement in the mid to basal inferolateral wall segments consistent   with prior area of infarction. There is also small focal subendocardial   delayed gadolinium enhancement involving the apical/apical septal   segments.   3. 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   4.  Normal right ventricular size and systolic function.   5.  No clinically significant valvular abnormalities.   6.  Normal biatrial size.     Immunization History   Administered Date(s) Administered    COVID-19 (MODERNA), mRNA vacc, 100 mcg/0.5 mL (PF) 12/09/2019, 01/06/2020         Assessment/Plan:  Dezion Jeanlouis is a 58 y.o. male with past medical history of heart failure with mildly reduced ejection fraction, frequent PVCs, NSVT, dilated cardiomyopathy, seizures, GERD who presents to establish care with [place].     His labs from May 08, 2023 were reviewed.  CBC is normal.  Kidney function is stable with creatinine of 1.07.  LFTs were normal.  Free T4 was normal.  Hemoglobin A1c is normal.  NT proBNP of 242.    #Heart failure with mildly reduced ejection fraction  #Dilated cardiomyopathy  #Frequent PVCs and NSVT  -He was diagnosed with heart failure with mildly reduced ejection fraction in September 2023, cardiac MRI showed LVEF of 39%.  LV was dilated.  There was presence of subendocardial LGE involving mid to basal inferolateral segment.  Details mentioned above.  -Echocardiogram from November 2024 was reviewed.  Details mentioned above.  LVEF of 40% with inferior and inferior lateral wall motion normalities.  -He had no recurrent angiogram or ischemic workup done till now.  -ECG in the clinic shows sinus rhythm with frequent PVCs and IVCD.  Plan  HF Therapy: GDMT started on 05/2023    BB: Start on beta-blocker, Metropol XL 12.5 mg p.o. daily    ACE-I/ARB: Consider adding during next visit    MRA: Start spironolactone  12.5 mg p.o. daily    SGLT2i: Consider during next clinical visit    ICD: LVEF greater than 35%, no history of infiltrative cardiomyopathy or genetic cardiomyopathy to have a current indication for ICD    Diuretic: None required  > Exact etiology of his dilated cardiomyopathy is unknown.  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  We will obtain CT coronary with FFR to see if he has significant CAD.  Depending on the results we will see if he needs coronary angiogram or not.  > We will obtain 2 weeks Zio patch for PVC burden.  > We will also obtain FDG PET scan to rule out cardiac sarcoidosis given frequent PVCs and NSVT  > We will obtain genetic testing to see if he has inherited cardiomyopathy.  He does have a brother who died suddenly at age 38 years.   > Repeat BMP in couple of weeks.    #Hyperlipidemia:  -Continue Lipitor 20 mg p.o. daily.  His last LDL was 120 in September 2023    #History of seizures:  -He follows with neurology at [facility].  He is on Briviact 50 mg twice daily.     #THC abuse:  -Advised abstinence.  Patient is currently not interested.    Justification for gene testing:  Genetic testing for the CardioNext genetic panel test (Test Code: [phone]) is medically necessary for my patient, [Mr. 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 This test is crucial for a comprehensive and accurate diagnosis due to the broad and overlapping nature of the patient's symptoms, which include:     Heart-related symptoms: shortness of breath, edema, fatigue, palpitations, and arrhythmias.  Sensory dysfunctions: numbness and tingling in extremities, sensitivity to pain and temperature, and pain in extremities.  Motor dysfunctions: muscle weakness, impaired balance, and difficulty walking.  Autonomic dysfunctions: orthostatic hypotension, early satiety, nausea and vomiting, changes in gastrointestinal motility, erectile dysfunction, and bladder dysfunction.  Renal issues: proteinuria and renal insufficiency/failure.  Bilateral carpal tunnel syndrome.  Lumbar spinal stenosis.  Unintentional weight loss.  Myocardial radiotracer uptake on bone scintigraphy in the absence of a monoclonal protein.  Gastrointestinal issues: diarrhea or constipation not responding to typical therapy, alternating bouts of diarrhea and constipation.    Given the complexity of these symptoms and their potential genetic underpinnings, it is medically necessary to conduct a comprehensive genetic analysis using the CardioNext? panel. This panel includes a broad spectrum of genes associated with hereditary ATTR amyloidosis and other related conditions, enabling [place] to identify the precise genetic factors contributing to the patient's condition.    The results from this genetic test will significantly influence the clinical management and treatment strategy for [ Mr. Ruel Mehring] . Without this test, the patient's diagnosis remains uncertain, and treatment strategies cannot be optimized.      Return to heart failure clinic: With me in couple of months    Thank you for allowing me to participate in the care of this patient.  Please do not hesitate to contact me should you have any questions or concerns.         Total time spent on today's office visit was 64 minutes.  This includes face-to-face in person visit with patient as well as nonface-to-face time including review of the EMR, outside records, labs, radiologic studies, echocardiogram & other cardiovascular studies, formulation of treatment plan, after visit summary, future disposition,  and lastly  on documentation.    Lyndel Safe, MD  Advanced Heart Failure and Heart Transplant Cardiologist  Center for Advanced Heart Failure and Heart Transplant  [facility]  Pager: [phone]

## 2023-05-14 NOTE — Progress Notes
To our valued patient,     We have enrolled your heart monitor and requested it be sent to your home.  You should receive this within 2-3 business days. Please wear the monitor for 14 days. When you have completed the study, please remove the device, and mail it back to the company. Please call [employer] Customer Service at [phone] with questions about placement, troubleshooting, and insurance coverage. You can reach the [facility] ambulatory heart monitor team at [phone].      Your Heart Rhythm Management Team  Cardiovascular Medicine Department at [facility]              Ambulatory (External) Cardiac Monitor Enrollment Record     Placement Location: Home Enrollment  Clinic Location: MPB5  Vendor: [employer] ([employer])  Mobile Cardiac Telemetry (MCOT/MCT)?: No  Duration of Monitor (in days): 14  Monitor Diagnosis: PVC (premature ventricular contraction) (I49.3)  Secondary Monitor Diagnosis: Coronary Artery Disease (CAD) (i25.10)  Ordering Provider: Lyndel Safe  AMB Monitor Serial Number: home  No data recorded    Start Time and Date: 05/14/23 11:06 AM   Patient Name: Johnathan Reese  DOB: [birth year]  MRN: [id number]  Sex: male  Mobile Phone Number: [phone] (mobile)  Home Phone Number: [phone]  Patient Address: [address] [place] [address]  Insurance Coverage: [facility] [employer]  Insurance ID: [id number]  Insurance Group #: [id number]  Insurance Subscriber: Johnathan Reese  Implanted Cardiac Device Information: No results found for: [email or web address]      Patient instructed to contact company phone number on the monitor box with questions regarding billing, placement, troubleshooting.     Johnathan Reese    ____________________________________________________________    Clinic Staff:    Complete additional steps for documentation double check/Co-Sign.  In Follow-up, send chart upon closing encounter to P [facility] HRM AMBULATORY MONITORS    HRM Ambulatory Monitoring Team:  Schedule on appropriate template and check-in.   Clinic Placement Schedule on clinic location [facility] schedule   Home Enrollment Schedule on Home Enrollment schedule ([facility] RHYTHM)   Given to patient in clinic for self-placement Schedule on Home Enrollment schedule ([facility] HRT RHYTHM)   Inpatient Schedule on [place] [facility] AMBULATORY MONITORING template   2. Please enroll with appropriate vendor.

## 2023-05-15 ENCOUNTER — Encounter: Admit: 2023-05-15 | Discharge: 2023-05-15 | Payer: BC Managed Care – HMO

## 2023-05-15 NOTE — Telephone Encounter
Received: Hassan Rowan, MD  [place], RN  No other changes apart from what made during the visit today.

## 2023-05-18 ENCOUNTER — Encounter: Admit: 2023-05-18 | Discharge: 2023-05-18 | Payer: BC Managed Care – HMO

## 2023-05-26 ENCOUNTER — Encounter: Admit: 2023-05-26 | Discharge: 2023-05-26 | Payer: BC Managed Care – HMO

## 2023-05-26 MED ORDER — NEBIVOLOL 5 MG PO TAB
ORAL_TABLET | ORAL | 0 refills | 60.00000 days | Status: AC
Start: 2023-05-26 — End: ?

## 2023-05-26 NOTE — Patient Instructions
Coronary CT Angiography Instructions    [place] [facility]  [id number]  [birth year]  05/26/2023    ARRIVAL TIME    Please report to the Cardiovascular Medicine Clinic at the [facility] on: 06/10/23  Please arrive at the following time: 7:30 for 8:00 CCTA      DO NOT EAT FOR 4 HOURS PRIOR TO YOUR PROCEDURE. Do NOT eat the AM of test  YOU SHOULD DRINK PLENTY OF CLEAR LIQUIDS UP TO ARRIVAL AT OFFICE.    Pre-Procedure Heart Rate Medication Instructions: Take all AM meds except do NOT take aldactone, celebrex or metoprolol the AM of test - TAke Bystolic 5mg  on 06/10/23 @ 6:30 AM (called to your pharm) It is important to take these medications exactly as written.  These medications prepare you for the procedure.                        Do not take any non-steroidal inflammatory medications, (ibuprofen, Aleve, Advil, etc.) for 48 hours beginning the day of the procedure.    Hold All Diuretics For 24 Hours Beginning The Day Of The Procedure: Verified    You May Take All Other Medications With Water The Morning Of The Procedure: Verified         SPECIAL ALLERGY INSTRUCTIONS         -No caffeine or smoking after midnight before the procedure.  -Please arrange for someone to drive you home after the procedure.  -No driving for 3 hours after the procedure.  -Please leave all valuables at home (wallet/purse).    If you have any questions, please call the [facility] office at [phone].

## 2023-05-27 ENCOUNTER — Encounter: Admit: 2023-05-27 | Discharge: 2023-05-27 | Payer: BC Managed Care – HMO

## 2023-06-08 ENCOUNTER — Encounter: Admit: 2023-06-08 | Discharge: 2023-06-08 | Payer: BC Managed Care – HMO

## 2023-06-10 ENCOUNTER — Ambulatory Visit: Admit: 2023-06-10 | Discharge: 2023-06-11 | Payer: BC Managed Care – HMO

## 2023-06-10 ENCOUNTER — Ambulatory Visit: Admit: 2023-06-10 | Discharge: 2023-06-10 | Payer: BC Managed Care – HMO

## 2023-06-10 ENCOUNTER — Encounter: Admit: 2023-06-10 | Discharge: 2023-06-10 | Payer: BC Managed Care – HMO

## 2023-06-11 ENCOUNTER — Encounter: Admit: 2023-06-11 | Discharge: 2023-06-11 | Payer: BC Managed Care – HMO

## 2023-06-11 MED ORDER — ASPIRIN 81 MG PO TBEC
81 mg | ORAL_TABLET | Freq: Every day | ORAL | 1 refills | Status: AC
Start: 2023-06-11 — End: ?

## 2023-06-12 ENCOUNTER — Encounter: Admit: 2023-06-12 | Discharge: 2023-06-12 | Payer: BC Managed Care – HMO

## 2023-06-12 NOTE — Telephone Encounter
LVM/MCM sent. See [facility] below:    Hi [name],    Your CT shows no significant coronary artery disease and you have minimal plaquing. Dr Hedda Slade recommends that you start taking aspirin 81mg  daily and we would like to repeat the chest CT in 6 months.     The order has been placed for the chest CT. In May or June please make sure you get this scheduled if you do not have it scheduled prior to that time. The radiology scheduling number is [phone].    If you have any questions please feel free to reach out.    Thank you,    Vivien Rota, RN-BSN  Advanced Heart Failure and   Hypertrophic Cardiomyopathy (HCM) Clinics  [facility]  Phone: [phone]  Fax: [phone]  Scheduling: [phone]

## 2023-06-12 NOTE — Telephone Encounter
-----   Message from Georges Mouse, MD sent at 06/12/2023  9:42 AM CST -----  CT without contrast is good.  ----- Message -----  From: Ulla Potash, RN  Sent: 06/11/2023   4:45 PM CST  To: Lyndel Safe, MD; [facility] Nurse Hf Team [place]    Hey Dr. Hedda Slade,    Would you be able to clarify if you would like the repeat CT to be with or without contrast? If with contrast, will need to complete labs to check creatinine level within 30 days. Last one is greater than 34 days.  ----- Message -----  From: Lyndel Safe, MD  Sent: 06/11/2023   3:47 PM CST  To: [facility] Nurse Hf Team [place]    Please let [name] know that his CT shows no significant coronary artery disease.  He has minimal plaquing.  Recommend starting him on aspirin 81 mg daily  Due to presence of 7 mm right middle lobe nodule we will repeat CT of the chest in 6 months.  I will wait on the results of FDG PET scan to see if is any active cardiac inflammation/lung inflammation.

## 2023-06-15 ENCOUNTER — Encounter: Admit: 2023-06-15 | Discharge: 2023-06-15 | Payer: BC Managed Care – HMO

## 2023-06-18 ENCOUNTER — Encounter: Admit: 2023-06-18 | Discharge: 2023-06-18 | Payer: BC Managed Care – HMO

## 2023-06-22 ENCOUNTER — Ambulatory Visit: Admit: 2023-06-22 | Discharge: 2023-06-23 | Payer: BC Managed Care – HMO

## 2023-06-22 ENCOUNTER — Encounter: Admit: 2023-06-22 | Discharge: 2023-06-22 | Payer: BC Managed Care – HMO

## 2023-06-24 ENCOUNTER — Encounter: Admit: 2023-06-24 | Discharge: 2023-06-24 | Payer: BC Managed Care – HMO

## 2023-07-23 ENCOUNTER — Encounter: Admit: 2023-07-23 | Discharge: 2023-07-23 | Payer: BC Managed Care – HMO

## 2023-07-23 MED ORDER — METOPROLOL SUCCINATE 25 MG PO TB24
25 mg | ORAL_TABLET | Freq: Every day | ORAL | 3 refills | 90.00000 days | Status: AC
Start: 2023-07-23 — End: ?

## 2023-08-02 ENCOUNTER — Encounter: Admit: 2023-08-02 | Discharge: 2023-08-02 | Payer: BC Managed Care – HMO

## 2023-08-05 ENCOUNTER — Encounter: Admit: 2023-08-05 | Discharge: 2023-08-05 | Payer: BC Managed Care – HMO

## 2023-08-06 ENCOUNTER — Encounter: Admit: 2023-08-06 | Discharge: 2023-08-06 | Payer: BC Managed Care – HMO

## 2023-08-06 ENCOUNTER — Ambulatory Visit: Admit: 2023-08-06 | Discharge: 2023-08-07 | Payer: BC Managed Care – HMO

## 2023-08-06 DIAGNOSIS — I493 Ventricular premature depolarization: Secondary | ICD-10-CM

## 2023-08-06 DIAGNOSIS — R9431 Abnormal electrocardiogram [ECG] [EKG]: Secondary | ICD-10-CM

## 2023-08-06 DIAGNOSIS — I4729 NSVT (nonsustained ventricular tachycardia) (CMS-HCC): Secondary | ICD-10-CM

## 2023-08-06 DIAGNOSIS — G4733 Obstructive sleep apnea (adult) (pediatric): Secondary | ICD-10-CM

## 2023-08-06 DIAGNOSIS — Z136 Encounter for screening for cardiovascular disorders: Secondary | ICD-10-CM

## 2023-08-06 DIAGNOSIS — R0609 Other forms of dyspnea: Secondary | ICD-10-CM

## 2023-08-06 DIAGNOSIS — I2510 Atherosclerotic heart disease of native coronary artery without angina pectoris: Secondary | ICD-10-CM

## 2023-08-06 LAB — BASIC METABOLIC PANEL
ANION GAP: 11
BLD UREA NITROGEN: 23
CALCIUM: 9.3
CHLORIDE: 108 — ABNORMAL HIGH (ref 98–107)
CO2: 21 — ABNORMAL LOW (ref 22–30)
CREATININE: 0.8
GFR ESTIMATED: 102
GLUCOSE,PANEL: 125 — ABNORMAL HIGH (ref 70–105)
POTASSIUM: 4.2
SODIUM: 140

## 2023-08-06 MED ORDER — EMPAGLIFLOZIN 10 MG PO TAB
10 mg | ORAL_TABLET | Freq: Every day | ORAL | 3 refills | Status: AC
Start: 2023-08-06 — End: ?

## 2023-08-06 MED ORDER — LOSARTAN 25 MG PO TAB
12.5 mg | ORAL_TABLET | Freq: Every day | ORAL | 3 refills | 90.00000 days | Status: AC
Start: 2023-08-06 — End: ?

## 2023-08-06 NOTE — Progress Notes
 Date of Service: 08/06/2023    Patient: Johnathan Reese; XBJ:[id number]; DOB: 1966-03-24  His PCP is Lona Kettle.    Referring physician:Leck, Nicola Girt, MD         Subjective:  Rogerio Boutelle is a 58 y.o. male who presents to the advanced heart failure clinic for the follow up visit.     He was diagnosed with heart failure with mildly reduced ejection fraction in September 2023: LVEF of 39% at that time.  LV end-diastolic volume index was 129 mL/m? cardiac MRI was done due to presence of significant PVCs and NSVT.    He has past medical history of heart failure with mildly reduced ejection fraction, frequent PVCs, NSVT, dilated cardiomyopathy, seizures, GERD.    Patient mentioned that he had recurrent episodes of seizures since May 2022.  Since change of his medications no more seizures since March 2024.  He does mention that when he has seizure activity he loses consciousness.    He was last seen by me in January 2025.  We did FDG PET scan and genetic testing.  FDG PET scan did not show any myocardial formation.  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  We also initiated GDMT.    Today, he presents to the clinic by himself for the follow-up visit.   He thinks his symptoms are stable.  Denies any shortness of breath at rest or with exertion.  His main complaint is fatigue and tiredness. They attribute this primarily to their sleep apnea and the side effects of their seizure medication, Briviact. They report that they have not slept longer than a four-hour stretch since their last seizure. They have tried using a CPAP machine for their sleep apnea in the past, but did not find it helpful. They are considering trying a mouth guard as recommended by their primary cardiologist, Dr. Barry Dienes.  He is currently not using CPAP or mouthguard.  Regarding their heart condition, the patient reports occasional minor chest pains and shortness of breath, particularly when their medication dosage was adjusted. They are currently on metoprolol and spironolactone to improve their heart function.     Baseline functional status: Walks independently.        Past Medical History:    Past Medical History:    Arthritis    Cardiac dysrhythmia    Chest pain    Depression    Dizziness    Generalized headaches    GERD (gastroesophageal reflux disease)    Seizure (CMS-HCC)    Sleep apnea    Undiagnosed cardiac murmurs          Surgical History:   Procedure Laterality Date    INGUINAL HERNIA REPAIR      TILT TABLE STUDY  Not yet    It was recommended, scheduled and then denied at [facility] but approved at diagnostic imaging center but i havent heard from them to scedule it.        Family History:  Patient mentioned that his brother passed away suddenly at the age of 47 years.  He also has history of dilated cardiomyopathy.  They have a total of 5 siblings but 1 brother died at age 31 years    Social History:  He used to work in [employer].  He also worked in Hotel manager.  Currently is remodeling his house.  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  He is unmarried.  He has no kids.  He lives alone.  He smokes marijuana daily.  He  denies smoking cigarettes.  He drinks maybe 1-2 drinks in a week.  No other illicit drugs.    Review of Systems:  General: Positive for fatigue and tiredness.  HEENT: negative for dry/itchy eyes, congestion, sore throat  Pulm: negative for cough, hemoptysis, and shortness of breath at rest  CV: as per HPI  GI: negative for N/V, abdominal pain, blood in stools, constipation, diarrhea  GU: negative for dysuria, hematuria  MSK: negative for joint pain, joint swelling, muscle pain/weakness  Neuro: negative for headaches, numbness/tingling, seizures, tremors  Heme/Lymph: negative for bleeding problems, bruising  Derm: No rashes, dry skin, or skin lesions    Medications:    Current Outpatient Medications:     acetaminophen SR (TYLENOL 8 HOUR) 650 mg tablet, Take one tablet by mouth every 8 hours as needed for Pain., Disp: , Rfl:     aspirin EC (ASPIR-LOW) 81 mg tablet, Take one tablet by mouth daily., Disp: 90 tablet, Rfl: 1    atorvastatin (LIPITOR) 20 mg tablet, TAKE ONE TABLET BY MOUTH EVERY DAY, Disp: 90 tablet, Rfl: 3    BRIVIACT 50 mg tablet, TAKE ONE TABLET BY MOUTH TWICE DAILY, Disp: 60 tablet, Rfl: 5    celecoxib (CELEBREX) 50 mg capsule, Take one capsule by mouth daily., Disp: , Rfl:     metoprolol succinate XL (TOPROL XL) 25 mg extended release tablet, Take one tablet by mouth daily., Disp: 90 tablet, Rfl: 3    omeprazole DR (PRILOSEC) 20 mg capsule, Take one capsule by mouth daily before breakfast., Disp: , Rfl:     ondansetron (ZOFRAN ODT) 4 mg rapid dissolve tablet, Dissolve one tablet by mouth every 8 hours as needed for Nausea or Vomiting. Place on tongue to dissolve., Disp: 30 tablet, Rfl: 0    spironolactone (ALDACTONE) 25 mg tablet, Take one-half tablet by mouth daily. Take with food., Disp: 90 tablet, Rfl: 1    zolpidem (AMBIEN) 10 mg tablet, Take one tablet by mouth at bedtime as needed for Sleep., Disp: , Rfl:     Allergies:   Allergies   Allergen Reactions    Levetiracetam SEE COMMENTS     Mood swings, Shakiness, Suicidal ideations         Objective:  BP 126/77 (BP Source: Arm, Left Upper, Patient Position: Standing)  - Pulse 61  - Ht 185.4 cm (6' 1)  - Wt 76.7 kg (169 lb)  - SpO2 97%  - BMI 22.30 kg/m?   BP Readings from Last 3 Encounters:   08/06/23 126/77   06/10/23 111/63   05/14/23 129/83      Pulse Readings from Last 3 Encounters:   08/06/23 61   06/10/23 70   05/14/23 58     Wt Readings from Last 8 Encounters:   08/06/23 76.7 kg (169 lb)   06/22/23 72.6 kg (160 lb)   05/14/23 72.6 kg (160 lb)   04/21/23 76.2 kg (168 lb)   04/02/23 72.1 kg (159 lb)   09/30/22 77.6 kg (171 lb)   08/17/22 74.8 kg (165 lb)   06/22/22 74.7 kg (164 lb 10.9 oz)     Constitutional: He appears well-developed and well-nourished.   HENT:  Head: Normocephalic.   Mouth/Throat: Oropharynx is clear and moist.   Eyes: Conjunctivae are normal.   Neck: Normal range of motion. No JVD present.  Cardiovascular: Normal rate, regular rhythm, normal heart sounds and intact distal pulses.  No murmur heard.  Pulmonary/Chest: Effort normal and breath sounds normal. No respiratory distress.  He has no wheezes. He has no rales. He exhibits no chest wall tenderness.   Abdominal: Soft. Bowel sounds are normal. He exhibits no distension. There is no tenderness.   Musculoskeletal: Normal range of motion and muscular tone. He exhibits no edema or tenderness.   Neurological: He is alert and oriented to person, place, and time. No focal deficits.  Skin: Skin is warm. No erythema.   Psychiatric: He has a normal mood and affect. Judgment, behavior, and thought content normal.       Labs Reviewed:  CBC w/Diff    Lab Results   Component Value Date/Time    WBC 7.07 05/08/2023 12:00 AM    RBC 4.41 (L) 05/08/2023 12:00 AM    HGB 14.7 05/08/2023 12:00 AM    HCT 43.3 05/08/2023 12:00 AM    MCV 98.2 (H) 05/08/2023 12:00 AM    MCH 33.3 (H) 05/08/2023 12:00 AM    MCHC 33.9 05/08/2023 12:00 AM    RDW 13.4 08/21/2022 06:30 AM    PLTCT 237 05/08/2023 12:00 AM    MPV 10.2 05/08/2023 12:00 AM    Lab Results   Component Value Date/Time    NEUT 59.9 05/08/2023 12:00 AM    ANC 4.24 05/08/2023 12:00 AM    LYMA 31.7 05/08/2023 12:00 AM    ALC 2.24 05/08/2023 12:00 AM    MONA 6.4 05/08/2023 12:00 AM    AMC 0.45 05/08/2023 12:00 AM    EOSA 1 06/22/2022 04:07 PM    AEC 0.09 05/08/2023 12:00 AM    BASA 0.4 05/08/2023 12:00 AM    ABC 0.03 05/08/2023 12:00 AM        Comprehensive Metabolic Profile    Lab Results   Component Value Date/Time    NA 140 05/08/2023 12:00 AM    K 3.9 05/08/2023 12:00 AM    K 4.1 08/21/2022 06:30 AM    K 4.1 08/20/2022 08:53 AM    K 4.0 08/19/2022 10:38 PM    CL 108 (H) 05/08/2023 12:00 AM    CO2 21.0 (L) 05/08/2023 12:00 AM    GAP 11 05/08/2023 12:00 AM    BUN 23.9 05/08/2023 12:00 AM    CR 1.07 05/08/2023 12:00 AM    CR 0.94 08/21/2022 06:30 AM    CR 0.88 08/20/2022 08:53 AM    CR 0.96 08/19/2022 10:38 PM    GLU 116 (H) 05/08/2023 12:00 AM    Lab Results   Component Value Date/Time    CA 9.2 05/08/2023 12:00 AM    ALBUMIN 3.9 05/08/2023 12:00 AM    TOTPROT 7.5 05/08/2023 12:00 AM    ALKPHOS 71 05/08/2023 12:00 AM    AST 14 05/08/2023 12:00 AM    ALT 13 05/08/2023 12:00 AM    TOTBILI 0.72 05/08/2023 12:00 AM    GFR 81.4 05/08/2023 12:00 AM        Cardiac Enzymes Lipids   No results found for: BNP, TNI Lab Results   Component Value Date    CHOL 187 02/05/2022    TRIG 110 02/05/2022    HDL 45 02/05/2022    LDL 120 (H) 02/05/2022    VLDL 22 02/05/2022    CHOLHDLC 4 02/05/2022         Coagulation Endocrine   No results found for: INR, PT Lab Results   Component Value Date/Time    HGBA1C 5.4 05/08/2023 12:00 AM    TSH 0.31 (L) 05/08/2023 12:00 AM  Imaging and procedures reviewed:  Echo: 04/02/2023:    The left ventricle is severely dilated. The left ventricular wall thickness is normal. Mild eccentric hypertrophy.    The left ventricular systolic function is moderately reduced. The visually estimated ejection fraction is 40%.  LV end-diastolic dimension of 6.4 cm and LV diastolic volume index of 133 mL/m?    There are segmental wall motion abnormalities, as described below.    Grade I (mild) left ventricular diastolic dysfunction.    The right ventricle is mildly dilated. The right ventricular systolic function is normal. The pulmonary artery pressure could not be estimated due to inadequate tricuspid regurgitation signal.    Moderately dilated left atrium.    No significant valve dysfunction.    No pericardial effusion.    Echo in 08/16/21 at [facility]:  1.  Normal left ventricular systolic function.   2.  No color-flow or Doppler evidence of hemodynamically significant   valvular dysfunction.     CT cardiac 10/25/21:   1. Coronary Calcium (Agatston): 4, 18th percentile. No obstructive plaque.     Event monitor: 09/17/21:    36 events were available for review.  All of the episodes demonstrate   sinus rhythm.  There are frequent PVCs with a burden of 16%.  The PVCs are   polymorphic.  There was a single 9 beat run of nonsustained ventricular   tachycardia.     Cardiac MRI: 01/14/22:  1.  Technically difficult study with significant cardiac motion artifact   due to frequent PVCs.  LV end-diastolic volume index was 129 mL/m?. Calculated CO: 5.58 L/Min. Calculated CI: 2.72 L/Min/M2.   2.  Moderately dilated left ventricle. Moderately reduced left ventricular   systolic function; EF 39%. Hypokinesis and thinning of the mid to basal   inferolateral wall with associated subendocardial delayed gadolinium   enhancement in the mid to basal inferolateral wall segments consistent   with prior area of infarction. There is also small focal subendocardial   delayed gadolinium enhancement involving the apical/apical septal   segments.   3. 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   4.  Normal right ventricular size and systolic function.   5.  No clinically significant valvular abnormalities.   6.  Normal biatrial size.     FDG PET scan in February 2025:  No active myocarditis or cardiac sarcoidosis.     Underwent 14-day Zio patch: 05/2023: Sinus rhythm with PVCs of around 7.8%.  No sustained atrial arrhythmia.    CT FFR coronaries on 06/10/23:  1. CAD-RADS 1 - Minimal stenosis or plaque with no stenosis. Tiny   calcified plaque in the proximal LAD without stenosis. Mild narrowing of   an acute marginal branch in the RCA by HeartFlow analysis, likely too   small for intervention.     2. FFR CT was not performed as there was no potentially flow limiting   stenosis observed in the major coronary arteries.     3. Subcentimeter solid nodule in the right middle lobe measuring 7 mm. If   this is a known nodule, follow-up may not be required. If no prior imaging   is available for comparison, advise follow-up CT chest in 6-12 months to   reassess.     Immunization History   Administered Date(s) Administered    COVID-19 (MODERNA), mRNA vacc, 100 mcg/0.5 mL (PF) 12/09/2019, 01/06/2020         Assessment/Plan:  Jesus Genera is  a 58 y.o. male with past medical history of heart failure with mildly reduced ejection fraction, frequent PVCs, NSVT, dilated cardiomyopathy, seizures, GERD who presents  follow visit up.     His labs from May 08, 2023 were reviewed.    Hemoglobin A1c is normal.  NT proBNP of 242.    His labs from 08/06/2023 were reviewed sodium 140, potassium 4.2, BUN 23, creatinine 0.84.    #Heart failure with mildly reduced ejection fraction  #Dilated cardiomyopathy  #Frequent PVCs and NSVT  -He was diagnosed with heart failure with mildly reduced ejection fraction in September 2023, cardiac MRI showed LVEF of 39%.  LV was dilated.  There was presence of subendocardial LGE involving mid to basal inferolateral segment.  Details mentioned above.  -Echocardiogram from November 2024 was reviewed.  Details mentioned above.  LVEF of 40% with inferior and inferior lateral wall motion normalities.  -Gene testing was sent when he was in clinic last time in Jan 2025.  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  Of note he does have significant family history of brother dying suddenly at the age of 30 years.  -He underwent CT coronaries on 06/10/23: Minimal plaquing noted in the LAD.  He also had mild narrowing of the acute marginal branch of the RCA.  Plan  HF Therapy: GDMT started on 05/2023    BB: Continue metoprolol XL 25 mg p.o. daily    ACE-I/ARB: Start losartan 12.5 mg p.o. daily    MRA: Continue spironolactone 12.5 mg p.o. daily    SGLT2i: Start Jardiance 10 mg p.o. daily    ICD: LVEF greater than 35%, no history of genetic cardiomyopathy to have a current indication for ICD.    Diuretic: None required  > Exact etiology of his dilated cardiomyopathy is unknown.  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  FDG PET scan shows no inflammation/myocarditis.  He does have PVCs.  Gene testing showed variants of unknown significance but given his family history that could also be significant in his case.  > Extensive counsel regarding HFrEF was done.  Continue GDMT for lifelong.  > We will repeat echocardiogram in 6 months.    #Hyperlipidemia:  -Continue Lipitor 20 mg p.o. daily.  His last LDL was 120 in September 2023    #History of seizures:  -He follows with neurology at [facility].  He is on Briviact 50 mg twice daily.     #THC abuse:  -Advised abstinence.  Patient is currently not interested.    Sleep apnea  Poor sleep quality and fatigue possibly related to sleep apnea. Previous CPAP and mouth guard use noted. Follow-up with Dr. Herschell Dimes planned.  - Follow up with Dr. Herschell Dimes for sleep apnea management.    Return to heart failure clinic: With me in 6 months after repeat echocardiogram    Thank you for allowing me to participate in the care of this patient.  Please do not hesitate to contact me should you have any questions or concerns.         Total time spent on today's office visit was 42 minutes.  This includes face-to-face in person visit with patient as well as nonface-to-face time including review of the EMR, outside records, labs, radiologic studies, echocardiogram & other cardiovascular studies, formulation of treatment plan, after visit summary, future disposition,  and lastly on documentation.    Lyndel Safe, MD  Advanced Heart Failure and Heart Transplant Cardiologist  [facility]  [facility]  Pager: [phone]

## 2023-08-12 ENCOUNTER — Encounter: Admit: 2023-08-12 | Discharge: 2023-08-12 | Payer: BC Managed Care – HMO

## 2023-08-13 ENCOUNTER — Encounter: Admit: 2023-08-13 | Discharge: 2023-08-13 | Payer: BC Managed Care – HMO

## 2023-08-29 ENCOUNTER — Encounter: Admit: 2023-08-29 | Discharge: 2023-08-29 | Payer: BC Managed Care – HMO

## 2023-09-06 ENCOUNTER — Encounter: Admit: 2023-09-06 | Discharge: 2023-09-06 | Payer: BC Managed Care – HMO

## 2023-09-09 ENCOUNTER — Encounter: Admit: 2023-09-09 | Discharge: 2023-09-09 | Payer: BC Managed Care – HMO

## 2023-09-09 MED ORDER — MEXILETINE 150 MG PO CAP
150 mg | ORAL_CAPSULE | ORAL | 3 refills | 30.00000 days | Status: AC
Start: 2023-09-09 — End: ?

## 2023-09-09 NOTE — Telephone Encounter
 Recommendations called to patient. Patient verbalized understanding and has no further questions or concerns at this time. Patient verified D-T-L of upcoming appointment in May.

## 2023-09-09 NOTE — Telephone Encounter
 Johnathan Reese, Tarun, MD  Celesta Coke, RN  Phone Number: [phone]     Johnathan Reese, Sorry that you are feeling this way.  Lets stop the metoprolol  for now which has been linked with fatigue the most. Hopefully fatigue improves after this. Heart rate looks on lower side. We should prescribe mexiletine 150 mg three times a day for PVCs.    Lets Have appointment with me in 1 month or so. If he continues to have fatigue despite this, then we may have to consider CCM device therapy.          Previous Messages       ----- Message -----  From: Celesta Coke, RN  Sent: 09/07/2023  11:46 AM CDT  To: Radford Buffy, MD  Subject: medications                                      ----- Message from Celesta Coke, RN sent at 09/07/2023 11:46 AM CDT -----  Patient with complaints of ongoing fatigue. ToprolXL recently increased to 25mg  due to high PVC burden. Jardiance  and Losartan  started at last OV. Let me know if you have any recommendations. Thank you!     ----- Message sent from Celesta Coke, RN to Johnathan Reese at 09/07/2023 11:44 AM -----  Hi Johnathan Reese,    Yes, please regularly track your blood pressures and heart rates.The medicines you are currently on will help with the heart pump function. Waking up frequently with your sleep apnea can cause ongoing fatigue. It is very important to get good quality sleep. Please also continue working with your PCP to manage this. I will forward your concerns to Dr. Reida Car for his recommendations.    Thank you!  Verdis Glade, RN      ----- Message -----       From:Davari Richard Stamour       Sent:09/07/2023 10:50 AM CDT         OZ:[id number] Medical Advice Request Message List    Subject:medications    I haven't been doing it regularly and tracking it but i'll start. The one I just took was 103/71, 64 heart rate. I just got home from a short walk. Recent ones from the last week i do have were 125/71, 47.   126/84, 56.   124/85, 58.   122/79, 54.  I realize there are a lot of other factors in everything that's going on with me. I've been depressed again recently, but i'm working through it. My sleep has been better recently, I don't know if regulating my heart better would be the cause of that. Sleep apnea is still a problem so i wake up every 2 to 3 hours but at least i have been sleeping better lately. Also, when i was really tired i tried taking my briviact  at a different time and i think that made things worse. I was taking either my seizure med or my heart pills every 6 hours and i don't think that worked well. I felt out of it most of the day. Thanks for any help and i'll try to remember to take my blood pressure in the mornings and evenings. Thanks again, Johnathan Reese      ----- Message -----       From:Jordan P       Sent:09/07/2023 10:07 AM CDT         IO:[id number] Angus Bark  Subject:medications    Hi Johnathan Reese-  Thanks for the update. Would you be able to send us  your three most recent blood pressures and heart rates? I would like to see if the fatigue is associated with your blood pressures.    [place], RN, BSN, [employer] for [employer] [facility]  Phone: [phone]  Fax: [phone]  Scheduling: [phone]      ----- Message -----       From:Ahyan Richard Anand       Sent:09/06/2023 10:00 PM CDT         To:T Dalia, MD    Subject:medications    Do we just assume that the medications that were added in are making me better?  I don't look at side effects of medications when they are first prescribed because I don't want to imagine them. But when I start feeling things I look them up. I'm really wondering why during my last visit when I mentioned how tired I had been and asked about the medications you told me none of what you'd prescribed would be the cause. Either low energy, tiredness,or fatigue are listed as side effects to all 4 of those. Dr Clydene Darner thought maybe those meds along with the Briviact  could be the cause. But I don't want to risk decreasing the Briviact . I'm having some other side effects too that I think are from the medications but they're probably all manageable if the medication is necessary. But I don't feel any different and without any further testing how do I know it's helping my heart any. Thank you, Johnathan Reese

## 2023-09-28 ENCOUNTER — Encounter: Admit: 2023-09-28 | Discharge: 2023-09-28 | Payer: BC Managed Care – HMO

## 2023-09-29 ENCOUNTER — Encounter: Admit: 2023-09-29 | Discharge: 2023-09-29 | Payer: BC Managed Care – HMO

## 2023-09-29 NOTE — Telephone Encounter
 I took too long to reply to the last conversation about my blood pressure and heart rate readings. I feel a whole lot better after the last medication change. I haven't been near as tired lately. Thank you!!  These are my latest readings, not necessarily daily, but close to it. Starting with today. 102/72, 52.  110/80, 78  121/73, 59.  103/73, 68.  102/71, 71.  114/84, 80.  130/86, 68.  123/72, 72.  133/86, 55.  103/73, 65.  Now i'm curious about the atorvastatin  i'm taking. I've been having a lot of gas at night to the point that every time I wake up I feel I have to go to the bathroom. With my sleep apnea that's about every 2 to 3 hours. I know I should've asked someone first but I quit taking the atorvastatin  for several days. I still woke up often, but didn't feel the need to get up out of bed every time. That helped a lot too. I never asked Dr Lander Pines when he prescribed that how bad my numbers were. I've been walking more but my diet is about the same. Can I not take it until my upcoming appointment and see what my numbers are? Or is there something else I can take or maybe take it in the morning?   Thanks, Johnathan Reese

## 2023-10-01 ENCOUNTER — Encounter: Admit: 2023-10-01 | Discharge: 2023-10-01 | Payer: BC Managed Care – HMO

## 2023-10-01 DIAGNOSIS — I420 Dilated cardiomyopathy: Secondary | ICD-10-CM

## 2023-10-01 DIAGNOSIS — I5022 Chronic systolic (congestive) heart failure: Secondary | ICD-10-CM

## 2023-10-01 DIAGNOSIS — E7849 Other hyperlipidemia: Secondary | ICD-10-CM

## 2023-10-01 LAB — LIPID PROFILE
CHOLESTEROL/HDL %: 4
CHOLESTEROL: 137
TRIGLYCERIDES: 63
VLDL: 13

## 2023-10-06 ENCOUNTER — Ambulatory Visit: Admit: 2023-10-06 | Discharge: 2023-10-07 | Payer: BLUE CROSS/BLUE SHIELD

## 2023-10-06 ENCOUNTER — Encounter: Admit: 2023-10-06 | Discharge: 2023-10-06 | Payer: BLUE CROSS/BLUE SHIELD

## 2023-10-06 DIAGNOSIS — I493 Ventricular premature depolarization: Secondary | ICD-10-CM

## 2023-10-06 DIAGNOSIS — I420 Dilated cardiomyopathy: Secondary | ICD-10-CM

## 2023-10-06 DIAGNOSIS — E7849 Other hyperlipidemia: Secondary | ICD-10-CM

## 2023-10-06 DIAGNOSIS — I5022 Chronic systolic (congestive) heart failure: Secondary | ICD-10-CM

## 2023-10-06 DIAGNOSIS — F1210 Cannabis abuse, uncomplicated: Secondary | ICD-10-CM

## 2023-10-06 MED ORDER — ATORVASTATIN 20 MG PO TAB
20 mg | ORAL_TABLET | Freq: Every day | ORAL | 3 refills | 90.00000 days | Status: AC
Start: 2023-10-06 — End: ?

## 2023-10-06 MED ORDER — NEBIVOLOL 5 MG PO TAB
2.5 mg | ORAL_TABLET | Freq: Every day | ORAL | 1 refills | 60.00000 days | Status: AC
Start: 2023-10-06 — End: ?

## 2023-10-06 MED ORDER — LOSARTAN 25 MG PO TAB
25 mg | ORAL_TABLET | Freq: Every day | ORAL | 3 refills | 90.00000 days | Status: AC
Start: 2023-10-06 — End: ?

## 2023-10-06 NOTE — Progress Notes
 Date of Service: 10/06/2023    Patient: Johnathan Reese; IHK:[id number]; DOB: 01-24-1966  His PCP is Susan Ensign.    Referring physician:Leck, Arcadio Knuckles, MD         Subjective:  Johnathan Reese is a 58 y.o. male who presents to the advanced heart failure clinic for the follow up visit.     He was diagnosed with heart failure with mildly reduced ejection fraction in September 2023: LVEF of 39% at that time.  LV end-diastolic volume index was 129 mL/m? cardiac MRI was done due to presence of significant PVCs and NSVT.    He has past medical history of heart failure with mildly reduced ejection fraction, frequent PVCs, NSVT, dilated cardiomyopathy, seizures, GERD.    Patient mentioned that he had recurrent episodes of seizures since May 2022.  Since change of his medications no more seizures since March 2024.  He does mention that when he has seizure activity he loses consciousness.    He was last seen by me in January 2025.  We did FDG PET scan and genetic testing.  FDG PET scan did not show any myocardial inflammation.  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  We also initiated GDMT.    He was having symptoms of fatigue and tiredness and hence his metoprolol  was discontinued on September 09 2023.     Today, he presents to the clinic by himself for the follow-up visit.   He experiences daytime fatigue, which has improved since discontinuing metoprolol , though some fatigue persists. He smokes marijuana daily, which may contribute to this fatigue. He takes mexiletine three times daily and atorvastatin , which he resumed after a brief discontinuation. Atorvastatin  is taken before bedtime to reduce nighttime indigestion.  He restarted taking atorvastatin  because his LDL was greater than 70.     He experiences occasional shortness of breath during exercise, such as walking two miles daily, without loss of consciousness or significant leg swelling. He has sleep apnea, which disrupts sleep but shows some improvement in returning to sleep after waking.   They have tried using a CPAP machine for their sleep apnea in the past, but did not find it helpful. They are considering trying a mouth guard.  He is currently not using CPAP or mouthguard.  He was able to get sleep study appointment scheduled but not until next year.      Baseline functional status: Walks independently.        Past Medical History:    Past Medical History:    Arthritis    Cardiac dysrhythmia    Chest pain    Depression    Dizziness    Generalized headaches    GERD (gastroesophageal reflux disease)    Seizure (CMS-HCC)    Sleep apnea    Undiagnosed cardiac murmurs          Surgical History:   Procedure Laterality Date    INGUINAL HERNIA REPAIR      TILT TABLE STUDY  Not yet    It was recommended, scheduled and then denied at [facility] but approved at diagnostic imaging center but i havent heard from them to scedule it.        Family History:  Patient mentioned that his brother passed away suddenly at the age of 70 years.  He also has history of dilated cardiomyopathy.  They have a total of 5 siblings but 1 brother died at age 46 years    Social History:  He used to work in [employer].  He also worked in Hotel manager.  Currently is remodeling his house.  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  He is unmarried.  He has no kids.  He lives alone.  He smokes marijuana daily.  He denies smoking cigarettes.  He drinks maybe 1-2 drinks in a week.  No other illicit drugs.    Review of Systems:  General: Positive for fatigue and tiredness.  HEENT: negative for dry/itchy eyes, congestion, sore throat  Pulm: negative for cough, hemoptysis, and shortness of breath at rest  CV: as per HPI  GI: negative for N/V, abdominal pain, blood in stools, constipation, diarrhea  GU: negative for dysuria, hematuria  MSK: negative for joint pain, joint swelling, muscle pain/weakness  Neuro: negative for headaches, numbness/tingling, seizures, tremors  Heme/Lymph: negative for bleeding problems, bruising  Derm: No rashes, dry skin, or skin lesions    Medications:    Current Outpatient Medications:     acetaminophen  SR (TYLENOL  8 HOUR) 650 mg tablet, Take one tablet by mouth every 8 hours as needed for Pain., Disp: , Rfl:     aspirin  EC (ASPIR-LOW) 81 mg tablet, Take one tablet by mouth daily., Disp: 90 tablet, Rfl: 1    atorvastatin  (LIPITOR) 20 mg tablet, TAKE ONE TABLET BY MOUTH EVERY DAY, Disp: 90 tablet, Rfl: 3    baclofen (LIORESAL) 10 mg tablet, Take one tablet by mouth as Needed., Disp: , Rfl:     BRIVIACT  50 mg tablet, TAKE ONE TABLET BY MOUTH TWICE DAILY, Disp: 60 tablet, Rfl: 5    celecoxib (CELEBREX) 50 mg capsule, Take one capsule by mouth daily., Disp: , Rfl:     empagliflozin  (JARDIANCE ) 10 mg tablet, Take one tablet by mouth daily., Disp: 90 tablet, Rfl: 3    losartan  (COZAAR ) 25 mg tablet, Take one-half tablet by mouth daily. Take, Disp: 90 tablet, Rfl: 3    mexiletine (MEXITIL ) 150 mg capsule, Take one capsule by mouth every 8 hours., Disp: 270 capsule, Rfl: 3    omeprazole DR (PRILOSEC) 20 mg capsule, Take one capsule by mouth daily before breakfast., Disp: , Rfl:     ondansetron  (ZOFRAN  ODT) 4 mg rapid dissolve tablet, Dissolve one tablet by mouth every 8 hours as needed for Nausea or Vomiting. Place on tongue to dissolve., Disp: 30 tablet, Rfl: 0    spironolactone  (ALDACTONE ) 25 mg tablet, Take one-half tablet by mouth daily. Take with food., Disp: 90 tablet, Rfl: 1    zolpidem (AMBIEN) 10 mg tablet, Take one tablet by mouth at bedtime as needed for Sleep., Disp: , Rfl:     Allergies:   Allergies   Allergen Reactions    Levetiracetam SEE COMMENTS     Mood swings, Shakiness, Suicidal ideations         Objective:  BP 113/62 (BP Source: Arm, Left Upper, Patient Position: Sitting)  - Pulse 71  - Ht 185.4 cm (6' 1)  - Wt 75.5 kg (166 lb 6.4 oz)  - SpO2 97%  - BMI 21.95 kg/m?   BP Readings from Last 3 Encounters:   10/06/23 113/62   08/06/23 126/77   06/10/23 111/63      Pulse Readings from Last 3 Encounters:   10/06/23 71   08/06/23 61   06/10/23 70     Wt Readings from Last 8 Encounters:   10/06/23 75.5 kg (166 lb 6.4 oz)   08/06/23 76.7 kg (169 lb)   06/22/23 72.6 kg (160 lb)   05/14/23 72.6 kg (  160 lb)   04/21/23 76.2 kg (168 lb)   04/02/23 72.1 kg (159 lb)   09/30/22 77.6 kg (171 lb)   08/17/22 74.8 kg (165 lb)     Constitutional: He appears well-developed and well-nourished.   HENT:  Head: Normocephalic.   Mouth/Throat: Oropharynx is clear and moist.   Eyes: Conjunctivae are normal.   Neck: Normal range of motion. No JVD present.  Cardiovascular: Normal rate, regular rhythm, normal heart sounds and intact distal pulses.  No murmur heard.  Pulmonary/Chest: Effort normal and breath sounds normal. No respiratory distress. He has no wheezes. He has no rales. He exhibits no chest wall tenderness.   Abdominal: Soft. Bowel sounds are normal. He exhibits no distension. There is no tenderness.   Musculoskeletal: Normal range of motion and muscular tone. He exhibits no edema or tenderness.   Neurological: He is alert and oriented to person, place, and time. No focal deficits.  Skin: Skin is warm. No erythema.   Psychiatric: He has a normal mood and affect. Judgment, behavior, and thought content normal.       Labs Reviewed:  CBC w/Diff    Lab Results   Component Value Date/Time    WBC 7.07 05/08/2023 12:00 AM    RBC 4.41 (L) 05/08/2023 12:00 AM    HGB 14.7 05/08/2023 12:00 AM    HCT 43.3 05/08/2023 12:00 AM    MCV 98.2 (H) 05/08/2023 12:00 AM    MCH 33.3 (H) 05/08/2023 12:00 AM    MCHC 33.9 05/08/2023 12:00 AM    RDW 13.4 08/21/2022 06:30 AM    PLTCT 237 05/08/2023 12:00 AM    MPV 10.2 05/08/2023 12:00 AM    Lab Results   Component Value Date/Time    NEUT 59.9 05/08/2023 12:00 AM    ANC 4.24 05/08/2023 12:00 AM    LYMA 31.7 05/08/2023 12:00 AM    ALC 2.24 05/08/2023 12:00 AM    MONA 6.4 05/08/2023 12:00 AM    AMC 0.45 05/08/2023 12:00 AM    EOSA 1 06/22/2022 04:07 PM    AEC 0.09 05/08/2023 12:00 AM    BASA 0.4 05/08/2023 12:00 AM    ABC 0.03 05/08/2023 12:00 AM        Comprehensive Metabolic Profile    Lab Results   Component Value Date/Time    NA 140 08/06/2023 12:00 AM    K 4.2 08/06/2023 12:00 AM    K 3.9 05/08/2023 12:00 AM    K 4.1 08/21/2022 06:30 AM    K 4.1 08/20/2022 08:53 AM    CL 108 (H) 08/06/2023 12:00 AM    CO2 21.0 (L) 08/06/2023 12:00 AM    GAP 11 08/06/2023 12:00 AM    BUN 23.0 08/06/2023 12:00 AM    CR 0.84 08/06/2023 12:00 AM    CR 1.07 05/08/2023 12:00 AM    CR 0.94 08/21/2022 06:30 AM    CR 0.88 08/20/2022 08:53 AM    GLU 125 (H) 08/06/2023 12:00 AM    Lab Results   Component Value Date/Time    CA 9.3 08/06/2023 12:00 AM    ALBUMIN 3.9 05/08/2023 12:00 AM    TOTPROT 7.5 05/08/2023 12:00 AM    ALKPHOS 71 05/08/2023 12:00 AM    AST 14 05/08/2023 12:00 AM    ALT 13 05/08/2023 12:00 AM    TOTBILI 0.72 05/08/2023 12:00 AM    GFR 102.4 08/06/2023 12:00 AM        Cardiac Enzymes Lipids   No results  found for: BNP, TNI Lab Results   Component Value Date    CHOL 137 10/01/2023    TRIG 63 10/01/2023    HDL 39 (L) 10/01/2023    LDL 86 10/01/2023    VLDL 13 10/01/2023    CHOLHDLC 4 10/01/2023         Coagulation Endocrine   No results found for: INR, PT Lab Results   Component Value Date/Time    HGBA1C 5.4 05/08/2023 12:00 AM    TSH 0.31 (L) 05/08/2023 12:00 AM             Imaging and procedures reviewed:  Echo: 04/02/2023:    The left ventricle is severely dilated. The left ventricular wall thickness is normal. Mild eccentric hypertrophy.    The left ventricular systolic function is moderately reduced. The visually estimated ejection fraction is 40%.  LV end-diastolic dimension of 6.4 cm and LV diastolic volume index of 133 mL/m?    There are segmental wall motion abnormalities, as described below.    Grade I (mild) left ventricular diastolic dysfunction.    The right ventricle is mildly dilated. The right ventricular systolic function is normal. The pulmonary artery pressure could not be estimated due to inadequate tricuspid regurgitation signal.    Moderately dilated left atrium.    No significant valve dysfunction.    No pericardial effusion.    Echo in 08/16/21 at [facility]:  1.  Normal left ventricular systolic function.   2.  No color-flow or Doppler evidence of hemodynamically significant   valvular dysfunction.     CT cardiac 10/25/21:   1. Coronary Calcium (Agatston): 4, 18th percentile. No obstructive plaque.     Event monitor: 09/17/21:    36 events were available for review.  All of the episodes demonstrate   sinus rhythm.  There are frequent PVCs with a burden of 16%.  The PVCs are   polymorphic.  There was a single 9 beat run of nonsustained ventricular   tachycardia.     Cardiac MRI: 01/14/22:  1.  Technically difficult study with significant cardiac motion artifact   due to frequent PVCs.  LV end-diastolic volume index was 129 mL/m?. Calculated CO: 5.58 L/Min. Calculated CI: 2.72 L/Min/M2.   2.  Moderately dilated left ventricle. Moderately reduced left ventricular   systolic function; EF 39%. Hypokinesis and thinning of the mid to basal   inferolateral wall with associated subendocardial delayed gadolinium   enhancement in the mid to basal inferolateral wall segments consistent   with prior area of infarction. There is also small focal subendocardial   delayed gadolinium enhancement involving the apical/apical septal   segments.   3. 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   4.  Normal right ventricular size and systolic function.   5.  No clinically significant valvular abnormalities.   6.  Normal biatrial size.     FDG PET scan in February 2025:  No active myocarditis or cardiac sarcoidosis.     Underwent 14-day Zio patch: 05/2023: Sinus rhythm with PVCs of around 7.8%.  No sustained atrial arrhythmia.    CT FFR coronaries on 06/10/23:  1. CAD-RADS 1 - Minimal stenosis or plaque with no stenosis. Tiny calcified plaque in the proximal LAD without stenosis. Mild narrowing of   an acute marginal branch in the RCA by HeartFlow analysis, likely too   small for intervention.     2. FFR CT was not performed as there was no  potentially flow limiting   stenosis observed in the major coronary arteries.     3. Subcentimeter solid nodule in the right middle lobe measuring 7 mm. If   this is a known nodule, follow-up may not be required. If no prior imaging   is available for comparison, advise follow-up CT chest in 6-12 months to   reassess.     Immunization History   Administered Date(s) Administered    COVID-19 (MODERNA), mRNA vacc, 100 mcg/0.5 mL (PF) 12/09/2019, 01/06/2020         Assessment/Plan:  Johnathan Reese is a 58 y.o. male with past medical history of heart failure with mildly reduced ejection fraction, frequent PVCs, NSVT, dilated cardiomyopathy, seizures, GERD who presents  follow visit up.     His labs from 08/06/2023 were reviewed sodium 140, potassium 4.2, BUN 23, creatinine 0.84.    #Heart failure with mildly reduced ejection fraction  #Dilated cardiomyopathy  #Frequent PVCs and NSVT  -He was diagnosed with heart failure with mildly reduced ejection fraction in September 2023, cardiac MRI showed LVEF of 39%.  LV was dilated.  There was presence of subendocardial LGE involving mid to basal inferolateral segment.  Details mentioned above.  -Echocardiogram from November 2024 was reviewed.  Details mentioned above.  LVEF of 40% with inferior and inferior lateral wall motion normalities.  -Gene testing was sent when he was in clinic last time in Jan 2025.  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  Of note he does have significant family history of brother dying suddenly at the age of 74 years.  -He underwent CT coronaries on 06/10/23: Minimal plaquing noted in the LAD.  He also had mild narrowing of the acute marginal branch of the RCA.  Plan  HF Therapy: GDMT started on 05/2023    BB: start bystolic  2.5 mg daily.  He had fatigue and tiredness with metoprolol .    ACE-I/ARB: Increase losartan  from 12.5 mg p.o. daily to 25 mg daily.     MRA: Continue spironolactone  12.5 mg p.o. daily    SGLT2i: Continue Jardiance  10 mg p.o. daily    ICD: LVEF greater than 35%, no history of genetic cardiomyopathy to have a current indication for ICD.    Diuretic: None required  > He has idiopathic dilated cardiomyopathy.  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  FDG PET scan shows no inflammation/myocarditis.  He does have PVCs.  Gene testing showed variants of unknown significance but given his family history that could also be significant in his case.  > continue mexiletine 150 mg three times a day for history of PVCs.    > We will repeat echocardiogram in 3 months.    #Hyperlipidemia:  -Continue Lipitor 20 mg p.o. daily.  His last LDL was 120 in September 2023--> 86 on 10/01/23    #History of seizures:  -He follows with neurology at [facility].  He is on Briviact  50 mg twice daily.     #THC abuse:  -Advised abstinence.  Patient is currently not interested.    Sleep apnea  Poor sleep quality and fatigue possibly related to sleep apnea. Previous CPAP and mouth guard use noted.   - Follow up with Dr. [facility] for sleep apnea management.    Return to heart failure clinic: With me in 3 months after repeat echocardiogram    Thank you for allowing me  to participate in the care of this patient.  Please do not hesitate to contact me should you have any questions or concerns.         Total time spent on today's office visit was 32 minutes.  This includes face-to-face in person visit with patient as well as nonface-to-face time including review of the EMR, outside records, labs, radiologic studies, echocardiogram & other cardiovascular studies, formulation of treatment plan, after visit summary, future disposition,  and lastly on documentation.    Radford Buffy, MD  Advanced [facility] Failure [facility] Transplant Cardiologist  [facility]  [facility]  Pager: [phone]

## 2023-10-21 ENCOUNTER — Encounter: Admit: 2023-10-21 | Discharge: 2023-10-21 | Payer: BLUE CROSS/BLUE SHIELD

## 2023-10-29 ENCOUNTER — Encounter: Admit: 2023-10-29 | Discharge: 2023-10-29 | Payer: BLUE CROSS/BLUE SHIELD

## 2023-11-04 ENCOUNTER — Encounter: Admit: 2023-11-04 | Discharge: 2023-11-04 | Payer: BLUE CROSS/BLUE SHIELD

## 2023-11-19 ENCOUNTER — Encounter: Admit: 2023-11-19 | Discharge: 2023-11-19 | Payer: BLUE CROSS/BLUE SHIELD

## 2023-12-02 ENCOUNTER — Encounter: Admit: 2023-12-02 | Discharge: 2023-12-02 | Payer: BLUE CROSS/BLUE SHIELD

## 2023-12-04 ENCOUNTER — Encounter: Admit: 2023-12-04 | Discharge: 2023-12-04 | Payer: BLUE CROSS/BLUE SHIELD

## 2023-12-04 MED ORDER — ASPIRIN 81 MG PO TBEC
81 mg | ORAL_TABLET | Freq: Every day | ORAL | 1 refills | 30.00000 days | Status: AC
Start: 2023-12-04 — End: ?

## 2023-12-08 ENCOUNTER — Encounter: Admit: 2023-12-08 | Discharge: 2023-12-08 | Payer: BLUE CROSS/BLUE SHIELD

## 2023-12-10 ENCOUNTER — Encounter: Admit: 2023-12-10 | Discharge: 2023-12-10 | Payer: BLUE CROSS/BLUE SHIELD

## 2023-12-10 ENCOUNTER — Ambulatory Visit: Admit: 2023-12-10 | Discharge: 2023-12-10 | Payer: BLUE CROSS/BLUE SHIELD

## 2023-12-10 DIAGNOSIS — G4733 Obstructive sleep apnea (adult) (pediatric): Secondary | ICD-10-CM

## 2023-12-10 DIAGNOSIS — R0609 Other forms of dyspnea: Secondary | ICD-10-CM

## 2023-12-10 DIAGNOSIS — I5022 Chronic systolic (congestive) heart failure: Secondary | ICD-10-CM

## 2023-12-10 DIAGNOSIS — I493 Ventricular premature depolarization: Principal | ICD-10-CM

## 2023-12-10 DIAGNOSIS — I2510 Atherosclerotic heart disease of native coronary artery without angina pectoris: Secondary | ICD-10-CM

## 2023-12-10 DIAGNOSIS — E7849 Other hyperlipidemia: Secondary | ICD-10-CM

## 2023-12-10 DIAGNOSIS — G40319 Generalized idiopathic epilepsy and epileptic syndromes, intractable, without status epilepticus: Secondary | ICD-10-CM

## 2023-12-10 DIAGNOSIS — Z136 Encounter for screening for cardiovascular disorders: Secondary | ICD-10-CM

## 2023-12-10 DIAGNOSIS — I4729 NSVT (nonsustained ventricular tachycardia) (CMS-HCC): Secondary | ICD-10-CM

## 2023-12-10 DIAGNOSIS — R9431 Abnormal electrocardiogram [ECG] [EKG]: Secondary | ICD-10-CM

## 2023-12-10 NOTE — Assessment & Plan Note
 Current medication is effective--no seizure activity since March, 2024.  He's back to driving again.  He's followed by the [facility].

## 2023-12-10 NOTE — Assessment & Plan Note
 He bought a mandibular advancement device on [employer]; he hasn't tried using it yet.  He doesn't tolerate CPAP and clearly has a bad sleep disorder.

## 2023-12-10 NOTE — Assessment & Plan Note
 Lab Results   Component Value Date    CHOL 137 10/01/2023    TRIG 63 10/01/2023    HDL 39 (L) 10/01/2023    LDL 86 10/01/2023    VLDL 13 10/01/2023    CHOLHDLC 4 10/01/2023      Atorva 20/day

## 2023-12-10 NOTE — Progress Notes
 Date of Service: 12/10/2023    Johnathan Reese is a 58 y.o. male.       HPI     Charlena was in the [place] clinic today for follow-up regarding LV systolic dysfunction and ventricular ectopy.  I have seen him a few times over the past year and he was initially referred for frequent, asymptomatic PVCs.       He has a rather persistent seizure disorder and is being managed by Dr. Landazuri at [facility].  He had 8 days of seizure monitoring at [facility] in April, 2024, which resulted in a change in medications.  He's had no further seizures since starting the current medication.     From a cardiac standpoint the picture is a little bit confusing.  We did a cardiac MRI in September, 2023, and it looked different than the echocardiogram he had done over at [facility] a few months previously.  The echocardiogram had looked normal but the MRI suggested an ejection fraction of about 40% with some thinning and hypokinesis involving the inferolateral base.  He had already had coronary CT angiography at [facility] and does not have any significant obstructive coronary disease.  We checked a lipid profile and lipoprotein(a) looking for evidence of a metabolic disorder that might make him more prone to thrombus formation, but these numbers were either normal or only minimally abnormal.  His LDL was 120 and he does have a little bit of coronary calcification so he got started on atorvastatin  back in September.    I asked Dr. Horacio to see him and a FDG-PET in 05/2023 did not suggest myocarditis.  The patient's been started on GDMT for LV dysfunction.     He has some occasional mild palpitation symptoms now but nothing to suggest any syncope or near syncope.  He has a little bit of vestibular dysfunction and if he moves his head too rapidly he gets a little bit of nausea.     He has not had any chest discomfort.  He has a little bit of breathlessness at times that he attributes to being out of shape.     He does have sleep apnea and has not tolerated the CPAP device.  He ordered a mandibular advancement device on [employer], but hasn't taken it out of the box since it arrived several months ago!             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 PACs not found. PVC burden 16%.       NSVT (nonsustained ventricular tachycardia) (CMS-HCC) 11/19/2021    Abnormal EKG 11/19/2021     - 08/16/21- [facility]- Echo- Normal LV systolic function. No color flow or doppler evidence of hemodynamically significant valvular dysfunction.       Coronary artery disease involving native coronary artery of native heart without angina pectoris 11/19/2021     10/25/21- [facility]- Calcium Score- Total 4-LAD. LA and LV normal is size. No stigmata of prior infarction. Normal pulmonary venous drainage. No thickening or calcifications in the aortic and mitral valves.            Review of Systems   Constitutional: Negative.   HENT: Negative.     Eyes: Negative.    Cardiovascular: Negative.    Respiratory: Negative.     Endocrine: Negative.    Hematologic/Lymphatic: Negative.    Skin: Negative. Musculoskeletal: Negative.    Gastrointestinal: Negative.    Genitourinary: Negative.    Neurological: Negative.    Psychiatric/Behavioral: Negative.     Allergic/Immunologic: Negative.        Physical Exam    General Appearance: no distress   Skin: warm, no ulcers or xanthomas   Digits and Nails: no cyanosis or clubbing   Eyes: conjunctivae and lids normal, pupils are equal and round   Teeth/Gums/Palate: dentition unremarkable, no lesions   Lips & Oral Mucosa: no pallor or cyanosis   Neck Veins: normal JVP , neck veins are not distended   Thyroid: no nodules, masses, tenderness or enlargement   Chest Inspection: chest is normal in appearance   Respiratory Effort: breathing comfortably, no respiratory distress   Auscultation/Percussion: lungs clear to auscultation, no rales or rhonchi, no wheezing   PMI: PMI not enlarged or displaced   Cardiac Rhythm: regular rhythm and normal rate   Cardiac Auscultation: S1, S2 normal, no rub, no gallop   Murmurs: no murmur   Peripheral Circulation: normal peripheral circulation   Carotid Arteries: normal carotid upstroke bilaterally, no bruits   Radial Arteries: normal symmetric radial pulses   Abdominal Aorta: no abdominal aortic bruit   Pedal Pulses: normal symmetric pedal pulses   Lower Extremity Edema: no lower extremity edema   Abdominal Exam: soft, non-tender, no masses, bowel sounds normal   Liver & Spleen: no organomegaly   Gait & Station: walks without assistance   Muscle Strength: normal muscle tone   Orientation: oriented to time, place and person   Affect & Mood: appropriate and sustained affect   Language and Memory: patient responsive and seems to comprehend information   Neurologic Exam: neurological assessment grossly intact   Other: moves all extremities      Cardiovascular Health Factors  Vitals BP Readings from Last 3 Encounters:   12/10/23 106/70   10/06/23 113/62   08/06/23 126/77     Wt Readings from Last 3 Encounters:   12/10/23 72.9 kg (160 lb 12.8 oz) 10/06/23 75.5 kg (166 lb 6.4 oz)   08/06/23 76.7 kg (169 lb)     BMI Readings from Last 3 Encounters:   12/10/23 21.22 kg/m?   10/06/23 21.95 kg/m?   08/06/23 22.30 kg/m?      Smoking Social History     Tobacco Use   Smoking Status Never    Passive exposure: Never   Smokeless Tobacco Never      Lipid Profile Cholesterol   Date Value Ref Range Status   10/01/2023 137  Final  HDL   Date Value Ref Range Status   10/01/2023 39 (L) >=40 Final     LDL   Date Value Ref Range Status   10/01/2023 86  Final     Triglycerides   Date Value Ref Range Status   10/01/2023 63  Final      Blood Sugar Hemoglobin A1C   Date Value Ref Range Status   05/08/2023 5.4  Final     Glucose   Date Value Ref Range Status   08/06/2023 125 (H) 70 - 105 Final   05/08/2023 116 (H) 70 - 105 Final   08/21/2022 86 70 - 100 MG/DL Final          Problems Addressed Today  Encounter Diagnoses   Name Primary?    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 I suspect that he may have had an episode of myocarditis in the past and this scarring may be the PVC focus. Mexilitine 150 q8h.    MINOCA    Coronary artery disease involving native coronary artery of native heart without angina pectoris  10/2021 - Total calcium score of 4  05/2023 - CCTA showed very minimal plaque    He has minimal coronary disease and is tolerating the statin.    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    He was seen by Dr. Horacio in March, 2025, and is scheduled to see him again in September.  Losartan  25/d, Jardiance  10/d, spiro 12.5/d.  BP is somewhat low today, but he denies light headedness.  He doesn't check home BP regularly.    Cardiac MR in 2023 suggested prior inferolateral infarct.  FDG-PET 05/2023 did not show evidence of myocarditis.    OSA (obstructive sleep apnea)  He bought a mandibular advancement device on [employer]; he hasn't tried using it yet.  He doesn't tolerate CPAP and clearly has a bad sleep disorder.    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  He's back to driving again.  He's followed by the [facility].    Current Medications (including today's revisions)   acetaminophen  SR (TYLENOL  8 HOUR) 650 mg tablet Take one tablet by mouth every 8 hours as needed for Pain.    aspirin  EC (ASPIR-LOW) 81 mg tablet TAKE ONE TABLET BY MOUTH EVERY DAY    atorvastatin  (LIPITOR) 20 mg tablet Take one tablet by mouth  daily.    baclofen (LIORESAL) 10 mg tablet Take one tablet by mouth as Needed.    BRIVIACT  50 mg tablet TAKE ONE TABLET BY MOUTH TWICE DAILY    celecoxib (CELEBREX) 50 mg capsule Take one capsule by mouth daily.    empagliflozin  (JARDIANCE ) 10 mg tablet Take one tablet by mouth daily.    losartan  (COZAAR ) 25 mg tablet Take one tablet by mouth daily. Take    mexiletine (MEXITIL ) 150 mg capsule Take one capsule by mouth every 8 hours.    nebivoloL  (BYSTOLIC ) 5 mg tablet Take one-half tablet by mouth daily.    omeprazole DR (PRILOSEC) 20 mg capsule Take one capsule by mouth daily before breakfast.    spironolactone  (ALDACTONE ) 25 mg tablet Take one-half tablet by mouth daily. Take with food.    zolpidem (AMBIEN) 10 mg tablet Take one tablet by mouth at bedtime as needed for Sleep.     Total time spent on today's office visit was 45 minutes.  This includes face-to-face in person visit with patient as well as nonface-to-face time including review of the EMR, outside records, labs, radiologic studies, echocardiogram & other cardiovascular studies, formation of treatment plan, after visit summary, future disposition, and lastly on documentation.

## 2023-12-10 NOTE — Assessment & Plan Note
 10/2021 - Total calcium score of 4    He has minimal coronary disease and is tolerating the statin.

## 2023-12-15 ENCOUNTER — Encounter: Admit: 2023-12-15 | Discharge: 2023-12-15 | Payer: BLUE CROSS/BLUE SHIELD

## 2023-12-15 ENCOUNTER — Ambulatory Visit: Admit: 2023-12-15 | Discharge: 2023-12-16 | Payer: BLUE CROSS/BLUE SHIELD

## 2023-12-15 DIAGNOSIS — G40919 Epilepsy, unspecified, intractable, without status epilepticus: Principal | ICD-10-CM

## 2023-12-15 MED ORDER — PYRIDOXINE (VITAMIN B6) 50 MG PO TAB
50 mg | ORAL_TABLET | Freq: Every day | ORAL | 3 refills | Status: AC
Start: 2023-12-15 — End: ?

## 2023-12-15 NOTE — Progress Notes
 TELEHEALTH VISIT       History of Present Illness  I last saw Johnathan Reese on 02/26/2023 for undefined epilepsy.  He denies seizures or challenges with the brivaracetam .    He does note he's a bit more irritable.    Previous tests  [facility] (Feb 2024) - normal (report reviewed)  [place] [facility] (02/17/22) - no epileptogenic abnormality (imaging reviewed)  [facility] (06/24/22) - mild language deficits (report reviewed)  [place] [facility] (06/26/22) - depression due to epilepsy, insomnia (report reviewed)  [facility] (April 2024) - normal with one episode of restlessness / feeling behind his eye without EEG correlate (report reviewed)       Review of systems  The following systems were reviewed and were negative except as mentioned elsewhere:      Constitutional:  No fever/chills/sweat, weight loss, tiredness/fatigue, or poor appetite  Eyes:  No reduced vision or blurriness, double vision, or droopy eyelids  ENT:  No hearing loss, ringing in the ears, vertigo, or hoarseness  Cardiovascular:  No chest pain/angina or palpitations  Respiratory:  No shortness of breath or cough  Gastrointestinal:  No abdominal pain, nausea and/or vomiting, diarrhea, or constipation  Genitourinary:  No pain with urination, excessive urination, or incontinence  Musculoskeletal:  No back pain, neck pain, joint pain/redness/swelling, or myalgia  Skin:  No jaundice, rash, or change in sweating  Hematologic/ Lymphatic:  No anemia, easy bruising, bleeding, or enlarged lymph nodes  Endocrine:  No temperature intolerance, polyuria, or polydipsia  Allergic/ Immunological:  No severe allergic reaction  Psychiatric:  No depression or anxiety    Current Outpatient Medications on File Prior to Visit   Medication Sig Dispense Refill    acetaminophen  SR (TYLENOL  8 HOUR) 650 mg tablet Take one tablet by mouth every 8 hours as needed for Pain.      aspirin  EC (ASPIR-LOW) 81 mg tablet TAKE ONE TABLET BY MOUTH EVERY DAY 90 tablet 1    atorvastatin  (LIPITOR) 20 mg tablet Take one tablet by mouth daily. 90 tablet 3    baclofen (LIORESAL) 10 mg tablet Take one tablet by mouth as Needed.      BRIVIACT  50 mg tablet TAKE ONE TABLET BY MOUTH TWICE DAILY 60 tablet 5    celecoxib (CELEBREX) 50 mg capsule Take one capsule by mouth daily.      empagliflozin  (JARDIANCE ) 10 mg tablet Take one tablet by mouth daily. 90 tablet 3    losartan  (COZAAR ) 25 mg tablet Take one tablet by mouth daily. Take 90 tablet 3    mexiletine (MEXITIL ) 150 mg capsule Take one capsule by mouth every 8 hours. 270 capsule 3    nebivoloL  (BYSTOLIC ) 5 mg tablet Take one-half tablet by mouth daily. 45 tablet 1    omeprazole DR (PRILOSEC) 20 mg capsule Take one capsule by mouth daily before breakfast.      spironolactone  (ALDACTONE ) 25 mg tablet Take one-half tablet by mouth daily. Take with food. 90 tablet 1    zolpidem (AMBIEN) 10 mg tablet Take one tablet by mouth at bedtime as needed for Sleep.       No current facility-administered medications on file prior to visit.       Exam  General appearance: He is not in acute distress.    HEENT: The head is normocephalic without evidence of previous trauma.  Oropharynx is without obvious lesion.  Eyes: Normal sclera  Neck: Full range of motion  Pulmonary: Notable for unlabored breathing.  Skin: Exhibits normal temperature and showed no rash.    Extremities: No edema is noted.  Psychiatric: Normal thought process and appropriate affect    NEUROLOGICAL EXAM  Mental Status: The patient is attentive, alert, and oriented to person, time and place.  Language comprehension, naming, concentration, and recent/remote memory are intact.  Speech is fluent.    Cranial Nerves: Pupils are equal.  Visual fields are grossly intact.  Extraocular movements are intact without nystagmus.  Facial strength is normal.  Hearing is intact to conversation.  Tongue and uvular are in the midline.  Shoulder shrug is normal bilaterally.      Motor: Muscle bulk is normal.  Motor power is at least 3/5 in the bilateral deltoids, biceps, triceps, wrist extensors, grip, iliopsoas, quads, hamstrings, gastrocnemius, and tibialis anterior.    Sensory: Unable to assess    Cerebellar:  There is no abnormal movement.      Gait: Deferred       Impression  CLINICAL SUMMARY   Johnathan Reese is a pleasant 58 year old man with undefined epilepsy.  We'll continue his brivaracetam .  We can try adding pyridoxine  50mg  for irritability.    EPILEPSY CLASSIFICATION  Epileptogenic zone:   Unknown  Seizure semiology: Bilateral tonic clonic seizure  Etiology:  Unknown  Comorbidities:    Two brothers with febrile seizures    MANAGEMENT AND PLAN  During the visit I discussed my impression, recommended diagnostic studies, prognosis, risks and benefits of management, instructions for management, and importance of compliance.  After a discussion, the patient agrees with the plan.  Total time for this telehealth with video visit was 20 minutes.    RECOMMENDATIONS  1. Continue brivaracetam  50mg  2x daily  2. Trial pyridoxine  50mg  daily    The patient is instructed not to drive unless 6 months free of alteration of consciousness, not to work in close proximity of machines with moving parts, not to swim unsupervised or to work at high places. The patient is to shower (without accumulation of water) instead of taking a bath if unsupervised.    FOLLOWUP PLAN  I plan to see the patient back for follow-up in approximately 12 months.  The patient is instructed to contact us  if there is an exacerbation of the seizures or any side effects from the antiseizure medications.       This is a telemedicine visit that was performed with the originating site at [facility] and the distant site at his home.  Verbal consent to participate in telehealth visit was obtained. I discussed with the patient the nature of our telemedicine visits, that:     I would evaluate the patient and recommend diagnostics and treatments based on my assessment   Our sessions are not being recorded and that personal health information is protected   Our team would provide follow up care in person if/when the patient needs it

## 2024-01-05 ENCOUNTER — Encounter: Admit: 2024-01-05 | Discharge: 2024-01-05 | Payer: BLUE CROSS/BLUE SHIELD

## 2024-01-05 MED ORDER — ATORVASTATIN 20 MG PO TAB
20 mg | ORAL_TABLET | Freq: Every day | ORAL | 3 refills | 90.00000 days | Status: AC
Start: 2024-01-05 — End: ?

## 2024-01-29 ENCOUNTER — Encounter: Admit: 2024-01-29 | Discharge: 2024-01-29 | Payer: BLUE CROSS/BLUE SHIELD

## 2024-01-31 ENCOUNTER — Encounter: Admit: 2024-01-31 | Discharge: 2024-01-31 | Payer: BLUE CROSS/BLUE SHIELD

## 2024-02-05 ENCOUNTER — Ambulatory Visit: Admit: 2024-02-05 | Discharge: 2024-02-05 | Payer: BLUE CROSS/BLUE SHIELD

## 2024-02-05 ENCOUNTER — Encounter: Admit: 2024-02-05 | Discharge: 2024-02-05 | Payer: BLUE CROSS/BLUE SHIELD

## 2024-02-06 ENCOUNTER — Encounter: Admit: 2024-02-06 | Discharge: 2024-02-06 | Payer: BLUE CROSS/BLUE SHIELD

## 2024-02-09 ENCOUNTER — Encounter: Admit: 2024-02-09 | Discharge: 2024-02-09 | Payer: BLUE CROSS/BLUE SHIELD

## 2024-02-09 VITALS — BP 105/71 | HR 64 | Ht 73.0 in | Wt 161.8 lb

## 2024-02-09 DIAGNOSIS — R0609 Other forms of dyspnea: Secondary | ICD-10-CM

## 2024-02-09 DIAGNOSIS — R9431 Abnormal electrocardiogram [ECG] [EKG]: Secondary | ICD-10-CM

## 2024-02-09 DIAGNOSIS — Z01818 Encounter for other preprocedural examination: Secondary | ICD-10-CM

## 2024-02-09 DIAGNOSIS — I493 Ventricular premature depolarization: Secondary | ICD-10-CM

## 2024-02-09 DIAGNOSIS — I2510 Atherosclerotic heart disease of native coronary artery without angina pectoris: Secondary | ICD-10-CM

## 2024-02-09 DIAGNOSIS — Z136 Encounter for screening for cardiovascular disorders: Principal | ICD-10-CM

## 2024-02-09 DIAGNOSIS — I5022 Chronic systolic (congestive) heart failure: Secondary | ICD-10-CM

## 2024-02-09 DIAGNOSIS — F1210 Cannabis abuse, uncomplicated: Secondary | ICD-10-CM

## 2024-02-09 DIAGNOSIS — I420 Dilated cardiomyopathy: Secondary | ICD-10-CM

## 2024-02-09 DIAGNOSIS — G4733 Obstructive sleep apnea (adult) (pediatric): Secondary | ICD-10-CM

## 2024-02-09 DIAGNOSIS — I4729 NSVT (nonsustained ventricular tachycardia) (CMS-HCC): Secondary | ICD-10-CM

## 2024-02-09 MED ORDER — SPIRONOLACTONE 25 MG PO TAB
25 mg | ORAL_TABLET | Freq: Every day | ORAL | 3 refills | 90.00000 days | Status: AC
Start: 2024-02-09 — End: ?

## 2024-02-09 NOTE — Progress Notes
 Date of Service: 02/09/2024    Patient: Johnathan Reese; FMW:[id number]; DOB: 11-22-1965  His PCP is Kyung Goodell.    Referring physician:Taiwana Willison, Velva, MD         Subjective:  Johnathan Reese is a 58 y.o. male who presents to the advanced heart failure clinic for the follow up visit.     He was diagnosed with heart failure with mildly reduced ejection fraction in September 2023: LVEF of 39% at that time.  LV end-diastolic volume index was 129 mL/m? cardiac MRI was done due to presence of significant PVCs and NSVT.    He has past medical history of heart failure with mildly reduced ejection fraction, frequent PVCs, NSVT, dilated cardiomyopathy, seizures, GERD.    Patient mentioned that he had recurrent episodes of seizures since May 2022.  Since change of his medications no more seizures since March 2024.  He does mention that when he has seizure activity he loses consciousness.    He was last seen by me in January 2025.  We did FDG PET scan and genetic testing.  FDG PET scan did not show any myocardial inflammation.  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  We also initiated GDMT.    He was having symptoms of fatigue and tiredness and hence his metoprolol  was discontinued on September 09 2023.     Today, he presents to the clinic by himself for the follow-up visit.   He continues to have fatigue and tiredness.  But has been stable and no worsening of symptoms   He experiences occasional shortness of breath during exercise, such as walking two miles daily, without loss of consciousness or significant leg swelling. He has sleep apnea, which disrupts sleep but shows some improvement in returning to sleep after waking. They have tried using a CPAP machine for their sleep apnea in the past, but did not find it helpful. They are considering trying a mouth guard.      He denies any current chest pain or chest pressure.  He denies any leg swelling.  He is taking his medication regularly.  He is holding aspirin  currently due to recent colonoscopy.  He is going start aspirin  from tomorrow.    Baseline functional status: Walks independently.        Past Medical History:    Past Medical History:    Arthritis    Cardiac dysrhythmia    Chest pain    Depression    Dizziness    Generalized headaches    GERD (gastroesophageal reflux disease)    Memory loss    Seizure (CMS-HCC)    Sleep apnea    Undiagnosed cardiac murmurs          Surgical History:   Procedure Laterality Date    INGUINAL HERNIA REPAIR      TILT TABLE STUDY  Not yet    It was recommended, scheduled and then denied at [facility] but approved at diagnostic imaging center but i havent heard from them to scedule it.        Family History:  Patient mentioned that his brother passed away suddenly at the age of 43 years.  He also has history of dilated cardiomyopathy.  They have a total of 5 siblings but 1 brother died at age 14 years    Social History:  He used to work in [employer].  He also worked in Hotel manager.  Currently is remodeling his house.  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  He has no kids.  He lives alone.  He smokes marijuana daily.  He denies smoking cigarettes.  He drinks maybe 1-2 drinks in a week.  No other illicit drugs.    Review of Systems:  General: Positive for fatigue and tiredness.  HEENT: negative for dry/itchy eyes, congestion, sore throat  Pulm: negative for cough, hemoptysis, and shortness of breath at rest  CV: as per HPI  GI: negative for N/V, abdominal pain, blood in stools, constipation, diarrhea  GU: negative for dysuria, hematuria  MSK: negative for joint pain, joint swelling, muscle pain/weakness  Neuro: negative for headaches, numbness/tingling, seizures, tremors  Heme/Lymph: negative for bleeding problems, bruising  Derm: No rashes, dry skin, or skin lesions    Medications:    Current Outpatient Medications:     acetaminophen  SR (TYLENOL  8 HOUR) 650 mg tablet, Take one tablet by mouth every 8 hours as needed for Pain., Disp: , Rfl:     aspirin  EC (ASPIR-LOW) 81 mg tablet, TAKE ONE TABLET BY MOUTH EVERY DAY, Disp: 90 tablet, Rfl: 1    atorvastatin  (LIPITOR) 20 mg tablet, TAKE ONE TABLET BY MOUTH EVERY DAY, Disp: 90 tablet, Rfl: 3    baclofen (LIORESAL) 10 mg tablet, Take one tablet by mouth as Needed., Disp: , Rfl:     BRIVIACT  50 mg tablet, TAKE ONE TABLET BY MOUTH TWICE DAILY, Disp: 60 tablet, Rfl: 5    celecoxib (CELEBREX) 50 mg capsule, Take one capsule by mouth daily., Disp: , Rfl:     empagliflozin  (JARDIANCE ) 10 mg tablet, Take one tablet by mouth daily., Disp: 90 tablet, Rfl: 3    losartan  (COZAAR ) 25 mg tablet, Take one tablet by mouth daily. Take, Disp: 90 tablet, Rfl: 3    mexiletine (MEXITIL ) 150 mg capsule, Take one capsule by mouth every 8 hours., Disp: 270 capsule, Rfl: 3    nebivoloL  (BYSTOLIC ) 5 mg tablet, Take one-half tablet by mouth daily., Disp: 45 tablet, Rfl: 1    omeprazole DR (PRILOSEC) 20 mg capsule, Take one capsule by mouth daily before breakfast., Disp: , Rfl:     pyridoxine  (vitamin B6) (VITAMIN B-6) 50 mg tablet, Take one tablet by mouth daily., Disp: 90 tablet, Rfl: 3    spironolactone  (ALDACTONE ) 25 mg tablet, Take one-half tablet by mouth daily. Take with food., Disp: 90 tablet, Rfl: 1    Allergies:   Allergies   Allergen Reactions    Levetiracetam SEE COMMENTS     Mood swings, Shakiness, Suicidal ideations         Objective:  There were no vitals taken for this visit.  BP Readings from Last 3 Encounters:   02/05/24 114/77   12/10/23 106/70   10/06/23 113/62      Pulse Readings from Last 3 Encounters:   12/10/23 55   10/06/23 71   08/06/23 61     Wt Readings from Last 8 Encounters:   02/05/24 73 kg (161 lb)   12/15/23 72.6 kg (160 lb)   12/10/23 72.9 kg (160 lb 12.8 oz)   10/06/23 75.5 kg (166 lb 6.4 oz)   08/06/23 76.7 kg (169 lb)   06/22/23 72.6 kg (160 lb)   05/14/23 72.6 kg (160 lb)   04/21/23 76.2 kg (168 lb)     Constitutional: He appears well-developed and well-nourished.   HENT:  Head: Normocephalic.   Mouth/Throat: Oropharynx is clear and moist.   Eyes: Conjunctivae are normal.   Neck: Normal  range of motion. No JVD present.  Cardiovascular: Normal rate, regular rhythm, normal heart sounds and intact distal pulses.  No murmur heard.  Pulmonary/Chest: Effort normal and breath sounds normal. No respiratory distress. He has no wheezes. He has no rales. He exhibits no chest wall tenderness.   Abdominal: Soft. Bowel sounds are normal. He exhibits no distension. There is no tenderness.   Musculoskeletal: Normal range of motion and muscular tone. He exhibits no edema or tenderness.   Neurological: He is alert and oriented to person, place, and time. No focal deficits.  Skin: Skin is warm. No erythema.   Psychiatric: He has a normal mood and affect. Judgment, behavior, and thought content normal.       Labs Reviewed:  CBC w/Diff    Lab Results   Component Value Date/Time    WBC 7.07 05/08/2023 12:00 AM    RBC 4.41 (L) 05/08/2023 12:00 AM    HGB 14.7 05/08/2023 12:00 AM    HCT 43.3 05/08/2023 12:00 AM    MCV 98.2 (H) 05/08/2023 12:00 AM    MCH 33.3 (H) 05/08/2023 12:00 AM    MCHC 33.9 05/08/2023 12:00 AM    RDW 13.4 08/21/2022 06:30 AM    PLTCT 237 05/08/2023 12:00 AM    MPV 10.2 05/08/2023 12:00 AM    Lab Results   Component Value Date/Time    NEUT 59.9 05/08/2023 12:00 AM    ANC 4.24 05/08/2023 12:00 AM    LYMA 31.7 05/08/2023 12:00 AM    ALC 2.24 05/08/2023 12:00 AM    MONA 6.4 05/08/2023 12:00 AM    AMC 0.45 05/08/2023 12:00 AM    EOSA 1 06/22/2022 04:07 PM    AEC 0.09 05/08/2023 12:00 AM    BASA 0.4 05/08/2023 12:00 AM    ABC 0.03 05/08/2023 12:00 AM        Comprehensive Metabolic Profile    Lab Results   Component Value Date/Time    NA 140 08/06/2023 12:00 AM    K 4.2 08/06/2023 12:00 AM    K 3.9 05/08/2023 12:00 AM    K 4.1 08/21/2022 06:30 AM    K 4.1 08/20/2022 08:53 AM    CL 108 (H) 08/06/2023 12:00 AM    CO2 21.0 (L) 08/06/2023 12:00 AM    GAP 11 08/06/2023 12:00 AM    BUN 23.0 08/06/2023 12:00 AM    CR 0.84 08/06/2023 12:00 AM    CR 1.07 05/08/2023 12:00 AM    CR 0.94 08/21/2022 06:30 AM    CR 0.88 08/20/2022 08:53 AM    GLU 125 (H) 08/06/2023 12:00 AM    Lab Results   Component Value Date/Time    CA 9.3 08/06/2023 12:00 AM    ALBUMIN 3.9 05/08/2023 12:00 AM    TOTPROT 7.5 05/08/2023 12:00 AM    ALKPHOS 71 05/08/2023 12:00 AM    AST 14 05/08/2023 12:00 AM    ALT 13 05/08/2023 12:00 AM    TOTBILI 0.72 05/08/2023 12:00 AM    GFR 102.4 08/06/2023 12:00 AM        Cardiac Enzymes Lipids   No results found for: BNP, TNI Lab Results   Component Value Date    CHOL 137 10/01/2023    TRIG 63 10/01/2023    HDL 39 (L) 10/01/2023    LDL 86 10/01/2023    VLDL 13 10/01/2023    CHOLHDLC 4 10/01/2023         Coagulation Endocrine   No results found for:  INR, PT Lab Results   Component Value Date/Time    HGBA1C 5.4 05/08/2023 12:00 AM    TSH 0.31 (L) 05/08/2023 12:00 AM             Imaging and procedures reviewed:  Echo:   02/05/24;    The left ventricular systolic function is mildly reduced. The ejection fraction by Simpson's biplane method is 45%. There are segmental wall motion abnormalities, as described below. predominantly akinesis of mid to distal inferolateral and anterolateral wall.    The left ventricle is severely dilated. LV end diastolic volume index of 145 ml/m2.    Grade I (mild) left ventricular diastolic dysfunction. Normal left atrial pressure.    The right ventricular size is normal. The right ventricular systolic function is normal.    No significant valve issues.    Normal central venous pressure (0-5 mm Hg).    No pericardial effusion.     Echo compared to 04/02/2023: No significant changes.     04/02/2023:    The left ventricle is severely dilated. The left ventricular wall thickness is normal. Mild eccentric hypertrophy.    The left ventricular systolic function is moderately reduced. The visually estimated ejection fraction is 40%.  LV end-diastolic dimension of 6.4 cm and LV diastolic volume index of 133 mL/m?    There are segmental wall motion abnormalities, as described below.    Grade I (mild) left ventricular diastolic dysfunction.    The right ventricle is mildly dilated. The right ventricular systolic function is normal. The pulmonary artery pressure could not be estimated due to inadequate tricuspid regurgitation signal.    Moderately dilated left atrium.    No significant valve dysfunction.    No pericardial effusion.    Echo in 08/16/21 at [facility]:  1.  Normal left ventricular systolic function.   2.  No color-flow or Doppler evidence of hemodynamically significant   valvular dysfunction.     CT cardiac 10/25/21:   1. Coronary Calcium (Agatston): 4, 18th percentile. No obstructive plaque.     Event monitor: 09/17/21:    36 events were available for review.  All of the episodes demonstrate   sinus rhythm.  There are frequent PVCs with a burden of 16%.  The PVCs are   polymorphic.  There was a single 9 beat run of nonsustained ventricular   tachycardia.     Cardiac MRI: 01/14/22:  1.  Technically difficult study with significant cardiac motion artifact   due to frequent PVCs.  LV end-diastolic volume index was 129 mL/m?. Calculated CO: 5.58 L/Min. Calculated CI: 2.72 L/Min/M2.   2.  Moderately dilated left ventricle. Moderately reduced left ventricular   systolic function; EF 39%. Hypokinesis and thinning of the mid to basal   inferolateral wall with associated subendocardial delayed gadolinium   enhancement in the mid to basal inferolateral wall segments consistent   with prior area of infarction. There is also small focal subendocardial   delayed gadolinium enhancement involving the apical/apical septal   segments.   3. 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   4.  Normal right ventricular size and systolic function.   5.  No clinically significant valvular abnormalities.   6. Normal biatrial size.     FDG PET scan in February 2025:  No active myocarditis or cardiac sarcoidosis.     Underwent 14-day Zio patch: 05/2023: Sinus rhythm with PVCs of around 7.8%.  No sustained atrial arrhythmia.  CT FFR coronaries on 06/10/23:  1. CAD-RADS 1 - Minimal stenosis or plaque with no stenosis. Tiny   calcified plaque in the proximal LAD without stenosis. Mild narrowing of   an acute marginal branch in the RCA by HeartFlow analysis, likely too   small for intervention.     2. FFR CT was not performed as there was no potentially flow limiting   stenosis observed in the major coronary arteries.     3. Subcentimeter solid nodule in the right middle lobe measuring 7 mm. If   this is a known nodule, follow-up may not be required. If no prior imaging   is available for comparison, advise follow-up CT chest in 6-12 months to   reassess.     Immunization History   Administered Date(s) Administered    COVID-19 (MODERNA), mRNA vacc, 100 mcg/0.5 mL (PF) 12/09/2019, 01/06/2020         Assessment/Plan:  Johnathan Reese is a 58 y.o. male with past medical history of heart failure with mildly reduced ejection fraction, frequent PVCs, NSVT, dilated cardiomyopathy, seizures, GERD who presents  follow visit up.     #Heart failure with mildly reduced ejection fraction  #Dilated cardiomyopathy  #Frequent PVCs and NSVT  -He was diagnosed with heart failure with mildly reduced ejection fraction in September 2023, cardiac MRI showed LVEF of 39%.  LV was dilated.  There was presence of subendocardial LGE involving mid to basal inferolateral segment.  Details mentioned above. FDG PET scan shows no inflammation/myocarditis.  -Echocardiogram from November 2024 was reviewed.  Details mentioned above.  LVEF of 40% with inferior and inferior lateral wall motion abnormalities.  He had repeat echocardiogram done in September 2025 which shows LVEF of 45% and has similar inferior lateral wall motion abnormalities  -Gene testing was sent when he was in clinic last time in Jan 2025.  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  Of note he does have significant family history of brother dying suddenly at the age of 23 years.  -He underwent CT coronaries on 06/10/23: Minimal plaquing noted in the LAD.  He also had mild narrowing of the acute marginal branch of the RCA.  Plan  HF Therapy: GDMT started on 05/2023    BB: Continue bystolic  2.5 mg daily.  He had fatigue and tiredness with metoprolol .    ACE-I/ARB: continue losartan  25 mg daily.     MRA: Increase spironolactone  12.5 mg p.o. daily---> 25 mg daily.     SGLT2i: Continue Jardiance  10 mg p.o. daily    ICD: LVEF greater than 35%, no history of genetic cardiomyopathy to have a current indication for ICD.    Diuretic: None required  > He has idiopathic dilated cardiomyopathy. He he may have had history of myocardial infarction with non obstructive coronary artery disease, obtain ammonia PET stress test to determine Myocardial blood flow, blood flow reserve and ischemia.  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   He does have PVCs.  Currently no indication for left heart cath.  > continue mexiletine 150 mg three times a day for history of PVCs.      #Hyperlipidemia:  -Continue Lipitor 20 mg p.o. daily.  His last LDL was 120 in September 2023--> 86 on 10/01/23.  Repeat lipid profile in 6 months.    #History of seizures:  -He follows with neurology at [facility].  He  is on Briviact  50 mg twice daily.     #THC abuse:  -Advised abstinence.  Patient is currently not interested in quitting.    Sleep apnea  Poor sleep quality and fatigue possibly related to sleep apnea. Previous CPAP and mouth guard use noted.   - Follow up with Dr. Cesar clinic for sleep apnea management.    Hernia, right inguinal hernia:  - He will be currently low risk from cardiac standpoint of view for hernia surgery.    Return to heart failure clinic: With me in 6 months.    Thank you for allowing me to participate in the care of this patient.  Please do not hesitate to contact me should you have any questions or concerns.         Total time spent on today's office visit was 42 minutes.  This includes face-to-face in person visit with patient as well as nonface-to-face time including review of the EMR, outside records, labs, radiologic studies, echocardiogram & other cardiovascular studies, formulation of treatment plan, after visit summary, future disposition,  and lastly on documentation.    Velva Pate, MD  Advanced [facility] Failure [facility] Heart Transplant Cardiologist  [facility]  [facility] [place]  Pager: [phone]

## 2024-02-14 ENCOUNTER — Encounter: Admit: 2024-02-14 | Discharge: 2024-02-14 | Payer: BLUE CROSS/BLUE SHIELD

## 2024-02-23 LAB — BASIC METABOLIC PANEL
ANION GAP: 8 % — ABNORMAL HIGH (ref 11.0–15.0)
BLD UREA NITROGEN: 24 10*6/uL — ABNORMAL LOW (ref 4.00–5.00)
CALCIUM: 8.5 fL — ABNORMAL HIGH (ref 80.0–100.0)
CHLORIDE: 108 ng/mL — ABNORMAL HIGH (ref 98–107)
CO2: 24 10*3/uL (ref 4.50–11.00)
CREATININE: 1 mmol/L — ABNORMAL LOW (ref 3.5–5.1)
GLUCOSE,PANEL: 97 % — ABNORMAL LOW (ref 36.0–45.0)
POTASSIUM: 4.5
SODIUM: 140

## 2024-02-24 ENCOUNTER — Encounter: Admit: 2024-02-24 | Discharge: 2024-02-24 | Payer: BLUE CROSS/BLUE SHIELD

## 2024-02-24 DIAGNOSIS — R9431 Abnormal electrocardiogram [ECG] [EKG]: Secondary | ICD-10-CM

## 2024-02-24 DIAGNOSIS — Z01818 Encounter for other preprocedural examination: Secondary | ICD-10-CM

## 2024-02-24 DIAGNOSIS — I493 Ventricular premature depolarization: Secondary | ICD-10-CM

## 2024-02-24 DIAGNOSIS — G4733 Obstructive sleep apnea (adult) (pediatric): Secondary | ICD-10-CM

## 2024-02-24 DIAGNOSIS — I4729 NSVT (nonsustained ventricular tachycardia) (CMS-HCC): Secondary | ICD-10-CM

## 2024-02-24 DIAGNOSIS — I5020 Unspecified systolic (congestive) heart failure: Secondary | ICD-10-CM

## 2024-02-24 DIAGNOSIS — I2510 Atherosclerotic heart disease of native coronary artery without angina pectoris: Secondary | ICD-10-CM

## 2024-02-24 DIAGNOSIS — F1210 Cannabis abuse, uncomplicated: Secondary | ICD-10-CM

## 2024-02-24 DIAGNOSIS — R0609 Other forms of dyspnea: Secondary | ICD-10-CM

## 2024-02-24 DIAGNOSIS — Z136 Encounter for screening for cardiovascular disorders: Principal | ICD-10-CM

## 2024-02-24 DIAGNOSIS — I420 Dilated cardiomyopathy: Secondary | ICD-10-CM

## 2024-02-25 ENCOUNTER — Encounter: Admit: 2024-02-25 | Discharge: 2024-02-25 | Payer: BLUE CROSS/BLUE SHIELD

## 2024-02-25 MED ORDER — NEBIVOLOL 5 MG PO TAB
2.5 mg | ORAL_TABLET | Freq: Every day | ORAL | 3 refills | 60.00000 days | Status: AC
Start: 2024-02-25 — End: ?

## 2024-04-21 ENCOUNTER — Encounter: Admit: 2024-04-21 | Discharge: 2024-04-21 | Payer: BLUE CROSS/BLUE SHIELD

## 2024-04-29 ENCOUNTER — Encounter: Admit: 2024-04-29 | Discharge: 2024-04-29 | Payer: BLUE CROSS/BLUE SHIELD

## 2024-05-06 ENCOUNTER — Encounter: Admit: 2024-05-06 | Discharge: 2024-05-06 | Payer: BLUE CROSS/BLUE SHIELD

## 2024-05-23 ENCOUNTER — Encounter: Admit: 2024-05-23 | Discharge: 2024-05-23 | Payer: BLUE CROSS/BLUE SHIELD

## 2024-05-27 ENCOUNTER — Encounter: Admit: 2024-05-27 | Discharge: 2024-05-27 | Payer: BLUE CROSS/BLUE SHIELD

## 2024-05-27 MED ORDER — ASPIRIN 81 MG PO TBEC
81 mg | ORAL_TABLET | Freq: Every day | ORAL | 1 refills | 30.00000 days | Status: AC
Start: 2024-05-27 — End: ?

## 2024-05-31 ENCOUNTER — Encounter: Admit: 2024-05-31 | Discharge: 2024-05-31 | Payer: BLUE CROSS/BLUE SHIELD

## 2024-06-08 ENCOUNTER — Encounter: Admit: 2024-06-08 | Discharge: 2024-06-08 | Payer: BLUE CROSS/BLUE SHIELD

## 2024-06-13 ENCOUNTER — Encounter: Admit: 2024-06-13 | Discharge: 2024-06-13 | Payer: BLUE CROSS/BLUE SHIELD
